# Patient Record
Sex: Male | Born: 1965
Health system: Southern US, Community
[De-identification: ages and names within clinical notes are randomized; demographics above are authoritative.]

## PROBLEM LIST (undated history)

## (undated) DIAGNOSIS — R55 Syncope and collapse: Secondary | ICD-10-CM

## (undated) DIAGNOSIS — I10 Essential (primary) hypertension: Secondary | ICD-10-CM

## (undated) DIAGNOSIS — K760 Fatty (change of) liver, not elsewhere classified: Secondary | ICD-10-CM

## (undated) DIAGNOSIS — G4733 Obstructive sleep apnea (adult) (pediatric): Secondary | ICD-10-CM

## (undated) DIAGNOSIS — E785 Hyperlipidemia, unspecified: Secondary | ICD-10-CM

## (undated) DIAGNOSIS — R519 Headache, unspecified: Secondary | ICD-10-CM

## (undated) DIAGNOSIS — R7303 Prediabetes: Secondary | ICD-10-CM

## (undated) DIAGNOSIS — R569 Unspecified convulsions: Secondary | ICD-10-CM

## (undated) DIAGNOSIS — Z8679 Personal history of other diseases of the circulatory system: Secondary | ICD-10-CM

## (undated) DIAGNOSIS — N200 Calculus of kidney: Secondary | ICD-10-CM

## (undated) HISTORY — PX: OTHER SURGICAL HISTORY: SHX169

---

## 2015-06-07 ENCOUNTER — Encounter: Payer: Self-pay | Admitting: *Deleted

## 2015-06-10 ENCOUNTER — Encounter
Admission: RE | Disposition: A | Discharge status: Home / Self Care | Payer: Self-pay | Source: Ambulatory Visit | Attending: Gastroenterology

## 2015-06-10 ENCOUNTER — Ambulatory Visit
Admission: RE | Admit: 2015-06-10 | Discharge: 2015-06-10 | Disposition: A | Discharge status: Home / Self Care | Payer: BLUE CROSS/BLUE SHIELD | Source: Ambulatory Visit | Attending: Gastroenterology | Admitting: Gastroenterology

## 2015-06-10 ENCOUNTER — Ambulatory Visit: Payer: BLUE CROSS/BLUE SHIELD | Admitting: *Deleted

## 2015-06-10 ENCOUNTER — Encounter: Payer: Self-pay | Admitting: *Deleted

## 2015-06-10 DIAGNOSIS — K621 Rectal polyp: Secondary | ICD-10-CM | POA: Insufficient documentation

## 2015-06-10 DIAGNOSIS — E785 Hyperlipidemia, unspecified: Secondary | ICD-10-CM | POA: Diagnosis not present

## 2015-06-10 DIAGNOSIS — E669 Obesity, unspecified: Secondary | ICD-10-CM | POA: Diagnosis not present

## 2015-06-10 DIAGNOSIS — G4730 Sleep apnea, unspecified: Secondary | ICD-10-CM | POA: Diagnosis not present

## 2015-06-10 DIAGNOSIS — R569 Unspecified convulsions: Secondary | ICD-10-CM | POA: Insufficient documentation

## 2015-06-10 DIAGNOSIS — Z6834 Body mass index (BMI) 34.0-34.9, adult: Secondary | ICD-10-CM | POA: Insufficient documentation

## 2015-06-10 DIAGNOSIS — Z888 Allergy status to other drugs, medicaments and biological substances status: Secondary | ICD-10-CM | POA: Diagnosis not present

## 2015-06-10 DIAGNOSIS — Z880 Allergy status to penicillin: Secondary | ICD-10-CM | POA: Diagnosis not present

## 2015-06-10 DIAGNOSIS — Z1211 Encounter for screening for malignant neoplasm of colon: Secondary | ICD-10-CM | POA: Insufficient documentation

## 2015-06-10 HISTORY — DX: Unspecified convulsions: R56.9

## 2015-06-10 HISTORY — PX: COLONOSCOPY WITH PROPOFOL: SHX5780

## 2015-06-10 HISTORY — DX: Hyperlipidemia, unspecified: E78.5

## 2015-06-10 SURGERY — COLONOSCOPY WITH PROPOFOL
Anesthesia: General

## 2015-06-10 MED ORDER — FENTANYL CITRATE (PF) 100 MCG/2ML IJ SOLN
INTRAMUSCULAR | Status: DC | PRN
  Administered 2015-06-10: 50 ug via INTRAVENOUS

## 2015-06-10 MED ORDER — SODIUM CHLORIDE 0.9 % IV SOLN
INTRAVENOUS | Status: DC
  Administered 2015-06-10: 15:00:00 via INTRAVENOUS

## 2015-06-10 MED ORDER — PROPOFOL 500 MG/50ML IV EMUL
INTRAVENOUS | Status: DC | PRN
  Administered 2015-06-10: 130 ug/kg/min via INTRAVENOUS

## 2015-06-10 MED ORDER — SODIUM CHLORIDE 0.9 % IV SOLN: INTRAVENOUS | Status: DC

## 2015-06-10 MED ORDER — MIDAZOLAM HCL 2 MG/2ML IJ SOLN
INTRAMUSCULAR | Status: DC | PRN
  Administered 2015-06-10: 1 mg via INTRAVENOUS

## 2015-06-10 NOTE — Transfer of Care (Signed)
Immediate Anesthesia Transfer of Care Note  Patient: Maxwell DoughtyDavid K Faron  Procedure(s) Performed: Procedure(s): COLONOSCOPY WITH PROPOFOL (N/A)  Patient Location: PACU and Endoscopy Unit  Anesthesia Type:General  Level of Consciousness: awake, alert  and oriented  Airway & Oxygen Therapy: Patient Spontanous Breathing and Patient connected to nasal cannula oxygen  Post-op Assessment: Report given to RN and Post -op Vital signs reviewed and stable  Post vital signs: Reviewed and stable  Last Vitals:  Filed Vitals:   06/10/15 1626  BP: 116/78  Pulse: 83  Temp: 35.7 C  Resp:     Complications: No apparent anesthesia complications

## 2015-06-10 NOTE — H&P (Signed)
Outpatient short stay form Pre-procedure 06/10/2015 3:49 PM Christena DeemMartin U Skulskie MD  Primary Physician: Dr. Hal Moralesavid Theis  Reason for visit:  Colonoscopy  History of present illness:  Patient is a-year-old male presenting for his initial colonoscopy. He takes no aspirin or blood thinning agents. He tolerated his prep well. He is no family history of colon cancer or colon polyps.    Current facility-administered medications:  .  0.9 %  sodium chloride infusion, , Intravenous, Continuous, Christena DeemMartin U Skulskie, MD, Last Rate: 10 mL/hr at 06/10/15 1515 .  0.9 %  sodium chloride infusion, , Intravenous, Continuous, Christena DeemMartin U Skulskie, MD  Prescriptions prior to admission  Medication Sig Dispense Refill Last Dose  . folic acid (FOLVITE) 400 MCG tablet Take 400 mcg by mouth daily.   06/09/2015 at Unknown time  . ibuprofen (ADVIL,MOTRIN) 200 MG tablet Take 200 mg by mouth every 6 (six) hours as needed.   Past Month at Unknown time  . phenytoin (DILANTIN) 100 MG ER capsule Take by mouth 3 (three) times daily.   06/09/2015 at Unknown time     Allergies  Allergen Reactions  . Keppra [Levetiracetam]   . Penicillins      Past Medical History  Diagnosis Date  . Seizures (HCC)   . Hyperlipidemia     Review of systems:      Physical Exam    Heart and lungs: Regular rate and rhythm without rub or gallop, lungs are bilaterally clear    HEENT: Normocephalic atraumatic eyes are anicteric    Other:     Pertinant exam for procedure: Soft nontender nondistended bowel sounds positive and normoactive    Planned proceedures: Colonoscopy and indicated procedures. I have discussed the risks benefits and complications of procedures to include not limited to bleeding, infection, perforation and the risk of sedation and the patient wishes to proceed.    Christena DeemMartin U Skulskie, MD Gastroenterology 06/10/2015  3:49 PM

## 2015-06-10 NOTE — Anesthesia Postprocedure Evaluation (Signed)
  Anesthesia Post-op Note  Patient: Maxwell DoughtyDavid K Monteverde  Procedure(s) Performed: Procedure(s): COLONOSCOPY WITH PROPOFOL (N/A)  Anesthesia type:General  Patient location: PACU  Post pain: Pain level controlled  Post assessment: Post-op Vital signs reviewed, Patient's Cardiovascular Status Stable, Respiratory Function Stable, Patent Airway and No signs of Nausea or vomiting  Post vital signs: Reviewed and stable  Last Vitals:  Filed Vitals:   06/10/15 1626  BP: 116/78  Pulse: 83  Temp: 35.7 C  Resp: 16    Level of consciousness: awake, alert  and patient cooperative  Complications: No apparent anesthesia complications

## 2015-06-10 NOTE — Anesthesia Preprocedure Evaluation (Signed)
Anesthesia Evaluation  Patient identified by MRN, date of birth, ID band Patient awake    Reviewed: Allergy & Precautions, NPO status , Patient's Chart, lab work & pertinent test results  Airway Mallampati: II  TM Distance: >3 FB Neck ROM: Full    Dental  (+) Teeth Intact   Pulmonary sleep apnea and Continuous Positive Airway Pressure Ventilation ,    Pulmonary exam normal        Cardiovascular Exercise Tolerance: Good negative cardio ROS Normal cardiovascular exam     Neuro/Psych Seizures -, Well Controlled,  Hx of seizure in 1995--none since--well controlled on anti-seizure meds.    GI/Hepatic negative GI ROS,   Endo/Other    Renal/GU      Musculoskeletal   Abdominal (+) + obese,   Peds  Hematology   Anesthesia Other Findings   Reproductive/Obstetrics                             Anesthesia Physical Anesthesia Plan  ASA: III  Anesthesia Plan: General   Post-op Pain Management:    Induction: Intravenous  Airway Management Planned: Nasal Cannula  Additional Equipment:   Intra-op Plan:   Post-operative Plan:   Informed Consent: I have reviewed the patients History and Physical, chart, labs and discussed the procedure including the risks, benefits and alternatives for the proposed anesthesia with the patient or authorized representative who has indicated his/her understanding and acceptance.     Plan Discussed with: CRNA  Anesthesia Plan Comments:         Anesthesia Quick Evaluation

## 2015-06-10 NOTE — Op Note (Signed)
[facility] Gastroenterology Patient Name: Maxwell Davis Procedure Date: 06/10/2015 3:52 PM MRN: [id number] Account #: [id number] Date of Birth: August 08, 1966 Admit Type: Outpatient Age: 49 Room: [facility] ENDO ROOM 3 Gender: Male Note Status: Finalized Procedure:         Colonoscopy Indications:       Screening for colorectal malignant neoplasm, This is the                     patient's first colonoscopy Providers:         Christena Deem, MD Referring MD:      Neomia Dear. Harrington Challenger, MD (Referring MD) Medicines:         Monitored Anesthesia Care Complications:     No immediate complications. Procedure:         Pre-Anesthesia Assessment:                    - ASA Grade Assessment: III - A patient with severe                     systemic disease.                    After obtaining informed consent, the colonoscope was                     passed under direct vision. Throughout the procedure, the                     patient's blood pressure, pulse, and oxygen saturations                     were monitored continuously. The Colonoscope was                     introduced through the anus and advanced to the the cecum,                     identified by appendiceal orifice and ileocecal valve. The                     quality of the bowel preparation was good. Findings:      Two sessile polyps were found in the rectum. The polyps were 1 to 2 mm       in size. These polyps were removed with a cold biopsy forceps. Resection       and retrieval were complete.      The digital rectal exam was normal.      The exam was otherwise without abnormality. Impression:        - Two 1 to 2 mm polyps in the rectum. Resected and                     retrieved.                    - The examination was otherwise normal. Recommendation:    - Await pathology results.                    - Telephone GI clinic for pathology results in 1 week. Procedure Code(s): --- Professional ---  [id number], Colonoscopy, flexible; with biopsy, single or                     multiple Diagnosis Code(s): --- Professional ---  V76.51, Special screening for malignant neoplasms of colon                    569.0, Anal and rectal polyp CPT copyright 2014 American Medical Association. All rights reserved. The codes documented in this report are preliminary and upon coder review may  be revised to meet current compliance requirements. Christena DeemMartin U Skulskie, MD 06/10/2015 4:24:21 PM This report has been signed electronically. Number of Addenda: 0 Note Initiated On: 06/10/2015 3:52 PM Scope Withdrawal Time: 0 hours 10 minutes 45 seconds  Total Procedure Duration: 0 hours 22 minutes 46 seconds       [facility]

## 2015-06-11 ENCOUNTER — Encounter: Payer: Self-pay | Admitting: Gastroenterology

## 2015-06-12 LAB — SURGICAL PATHOLOGY

## 2017-04-01 DIAGNOSIS — Z125 Encounter for screening for malignant neoplasm of prostate: Secondary | ICD-10-CM | POA: Diagnosis not present

## 2017-04-01 DIAGNOSIS — Z0000 Encounter for general adult medical examination without abnormal findings: Secondary | ICD-10-CM | POA: Diagnosis not present

## 2017-04-01 DIAGNOSIS — E782 Mixed hyperlipidemia: Secondary | ICD-10-CM | POA: Diagnosis not present

## 2017-04-01 DIAGNOSIS — R569 Unspecified convulsions: Secondary | ICD-10-CM | POA: Diagnosis not present

## 2017-04-01 DIAGNOSIS — Z79899 Other long term (current) drug therapy: Secondary | ICD-10-CM | POA: Diagnosis not present

## 2017-04-15 DIAGNOSIS — E669 Obesity, unspecified: Secondary | ICD-10-CM | POA: Diagnosis not present

## 2017-04-15 DIAGNOSIS — I10 Essential (primary) hypertension: Secondary | ICD-10-CM | POA: Diagnosis not present

## 2017-04-15 DIAGNOSIS — G4733 Obstructive sleep apnea (adult) (pediatric): Secondary | ICD-10-CM | POA: Diagnosis not present

## 2017-04-15 DIAGNOSIS — Z87898 Personal history of other specified conditions: Secondary | ICD-10-CM | POA: Diagnosis not present

## 2017-05-27 DIAGNOSIS — R569 Unspecified convulsions: Secondary | ICD-10-CM | POA: Diagnosis not present

## 2017-08-26 ENCOUNTER — Inpatient Hospital Stay (HOSPITAL_COMMUNITY)
Admit: 2017-08-26 | Discharge: 2017-08-26 | Disposition: A | Discharge status: Home / Self Care | Payer: BLUE CROSS/BLUE SHIELD | Attending: Internal Medicine | Admitting: Internal Medicine

## 2017-08-26 ENCOUNTER — Observation Stay
Admission: EM | Admit: 2017-08-26 | Discharge: 2017-08-27 | Disposition: A | Discharge status: Home / Self Care | Payer: BLUE CROSS/BLUE SHIELD | Attending: Internal Medicine | Admitting: Internal Medicine

## 2017-08-26 ENCOUNTER — Inpatient Hospital Stay: Payer: BLUE CROSS/BLUE SHIELD

## 2017-08-26 ENCOUNTER — Emergency Department: Payer: BLUE CROSS/BLUE SHIELD

## 2017-08-26 ENCOUNTER — Other Ambulatory Visit: Payer: Self-pay

## 2017-08-26 DIAGNOSIS — E785 Hyperlipidemia, unspecified: Secondary | ICD-10-CM | POA: Diagnosis not present

## 2017-08-26 DIAGNOSIS — I214 Non-ST elevation (NSTEMI) myocardial infarction: Secondary | ICD-10-CM | POA: Diagnosis not present

## 2017-08-26 DIAGNOSIS — R197 Diarrhea, unspecified: Secondary | ICD-10-CM | POA: Diagnosis not present

## 2017-08-26 DIAGNOSIS — Z79899 Other long term (current) drug therapy: Secondary | ICD-10-CM | POA: Diagnosis not present

## 2017-08-26 DIAGNOSIS — G4733 Obstructive sleep apnea (adult) (pediatric): Secondary | ICD-10-CM | POA: Diagnosis not present

## 2017-08-26 DIAGNOSIS — G40909 Epilepsy, unspecified, not intractable, without status epilepticus: Secondary | ICD-10-CM

## 2017-08-26 DIAGNOSIS — R748 Abnormal levels of other serum enzymes: Secondary | ICD-10-CM

## 2017-08-26 DIAGNOSIS — I10 Essential (primary) hypertension: Secondary | ICD-10-CM | POA: Insufficient documentation

## 2017-08-26 DIAGNOSIS — R7989 Other specified abnormal findings of blood chemistry: Secondary | ICD-10-CM | POA: Diagnosis not present

## 2017-08-26 DIAGNOSIS — Z6835 Body mass index (BMI) 35.0-35.9, adult: Secondary | ICD-10-CM | POA: Diagnosis not present

## 2017-08-26 DIAGNOSIS — I6523 Occlusion and stenosis of bilateral carotid arteries: Secondary | ICD-10-CM | POA: Diagnosis not present

## 2017-08-26 DIAGNOSIS — I959 Hypotension, unspecified: Principal | ICD-10-CM | POA: Insufficient documentation

## 2017-08-26 DIAGNOSIS — R55 Syncope and collapse: Secondary | ICD-10-CM | POA: Diagnosis not present

## 2017-08-26 DIAGNOSIS — I5A Non-ischemic myocardial injury (non-traumatic): Secondary | ICD-10-CM | POA: Diagnosis present

## 2017-08-26 DIAGNOSIS — M6281 Muscle weakness (generalized): Secondary | ICD-10-CM | POA: Diagnosis not present

## 2017-08-26 DIAGNOSIS — Z880 Allergy status to penicillin: Secondary | ICD-10-CM | POA: Insufficient documentation

## 2017-08-26 DIAGNOSIS — R778 Other specified abnormalities of plasma proteins: Secondary | ICD-10-CM | POA: Diagnosis present

## 2017-08-26 HISTORY — DX: Essential (primary) hypertension: I10

## 2017-08-26 HISTORY — DX: Morbid (severe) obesity due to excess calories: E66.01

## 2017-08-26 HISTORY — DX: Obstructive sleep apnea (adult) (pediatric): G47.33

## 2017-08-26 LAB — BASIC METABOLIC PANEL
Anion gap: 11 (ref 5–15)
BUN: 20 mg/dL (ref 6–20)
CALCIUM: 8.3 mg/dL — AB (ref 8.9–10.3)
CO2: 23 mmol/L (ref 22–32)
Chloride: 104 mmol/L (ref 101–111)
Creatinine, Ser: 1.21 mg/dL (ref 0.61–1.24)
GFR calc non Af Amer: >60 mL/min (ref >60)
Glucose, Bld: 243 mg/dL — ABNORMAL HIGH (ref 65–99)
Potassium: 3.7 mmol/L (ref 3.5–5.1)
Sodium: 138 mmol/L (ref 135–145)

## 2017-08-26 LAB — LIPID PANEL
CHOL/HDL RATIO: 4.3 ratio
CHOLESTEROL: 145 mg/dL (ref 0–200)
HDL: 34 mg/dL — AB (ref >40)
LDL Cholesterol: 78 mg/dL (ref 0–99)
Triglycerides: 165 mg/dL — ABNORMAL HIGH (ref <150)
VLDL: 33 mg/dL (ref 0–40)

## 2017-08-26 LAB — HEPATIC FUNCTION PANEL
ALT: 33 U/L (ref 17–63)
AST: 33 U/L (ref 15–41)
Albumin: 3.9 g/dL (ref 3.5–5.0)
Alkaline Phosphatase: 85 U/L (ref 38–126)
BILIRUBIN TOTAL: 0.7 mg/dL (ref 0.3–1.2)
Total Protein: 6.8 g/dL (ref 6.5–8.1)

## 2017-08-26 LAB — CBC WITH DIFFERENTIAL/PLATELET
BASOS ABS: 0 10*3/uL (ref 0–0.1)
BASOS PCT: 0 %
EOS ABS: 0.2 10*3/uL (ref 0–0.7)
EOS PCT: 2 %
HCT: 41.2 % (ref 40.0–52.0)
Hemoglobin: 14.2 g/dL (ref 13.0–18.0)
Lymphocytes Relative: 26 %
Lymphs Abs: 3.4 10*3/uL (ref 1.0–3.6)
MCH: 30.6 pg (ref 26.0–34.0)
MCHC: 34.4 g/dL (ref 32.0–36.0)
MCV: 89 fL (ref 80.0–100.0)
Monocytes Absolute: 0.6 10*3/uL (ref 0.2–1.0)
Monocytes Relative: 5 %
Neutro Abs: 8.7 10*3/uL — ABNORMAL HIGH (ref 1.4–6.5)
Neutrophils Relative %: 67 %
PLATELETS: 266 10*3/uL (ref 150–440)
RBC: 4.63 MIL/uL (ref 4.40–5.90)
RDW: 12.9 % (ref 11.5–14.5)
WBC: 12.9 10*3/uL — AB (ref 3.8–10.6)

## 2017-08-26 LAB — ECHOCARDIOGRAM COMPLETE
Height: 70 in
Weight: 3934.4 oz

## 2017-08-26 LAB — TROPONIN I
TROPONIN I: 0.06 ng/mL — AB (ref <0.03)
TROPONIN I: 0.27 ng/mL — AB (ref <0.03)
Troponin I: 0.29 ng/mL (ref <0.03)
Troponin I: 0.17 ng/mL (ref <0.03)
Troponin I: 0.1 ng/mL (ref <0.03)

## 2017-08-26 LAB — URINALYSIS, COMPLETE (UACMP) WITH MICROSCOPIC
Bacteria, UA: NONE SEEN
Bilirubin Urine: NEGATIVE
GLUCOSE, UA: NEGATIVE mg/dL
Ketones, ur: NEGATIVE mg/dL
Leukocytes, UA: NEGATIVE
Nitrite: NEGATIVE
PROTEIN: NEGATIVE mg/dL
Specific Gravity, Urine: 1.015 (ref 1.005–1.030)
pH: 5 (ref 5.0–8.0)

## 2017-08-26 LAB — LIPASE, BLOOD: Lipase: 39 U/L (ref 11–51)

## 2017-08-26 LAB — HEMOGLOBIN A1C
Hgb A1c MFr Bld: 5.4 % (ref 4.8–5.6)
MEAN PLASMA GLUCOSE: 108.28 mg/dL

## 2017-08-26 LAB — GLUCOSE, CAPILLARY
Glucose-Capillary: 88 mg/dL (ref 65–99)
Glucose-Capillary: 83 mg/dL (ref 65–99)

## 2017-08-26 LAB — CK: CK TOTAL: 131 U/L (ref 49–397)

## 2017-08-26 LAB — PHENYTOIN LEVEL, TOTAL: PHENYTOIN LVL: 7.1 ug/mL — AB (ref 10.0–20.0)

## 2017-08-26 MED ORDER — ONDANSETRON HCL 4 MG PO TABS: 4.0000 mg | ORAL_TABLET | Freq: Four times a day (QID) | ORAL | Status: DC | PRN

## 2017-08-26 MED ORDER — FOLIC ACID 1 MG PO TABS
500.0000 ug | ORAL_TABLET | Freq: Every day | ORAL | Status: DC
  Administered 2017-08-26: 0.5 mg via ORAL
  Filled 2017-08-26: qty 1

## 2017-08-26 MED ORDER — PHENYTOIN SODIUM EXTENDED 100 MG PO CAPS
400.0000 mg | ORAL_CAPSULE | Freq: Every day | ORAL | Status: DC
  Administered 2017-08-26: 400 mg via ORAL
  Filled 2017-08-26 (×2): qty 4

## 2017-08-26 MED ORDER — LISINOPRIL 20 MG PO TABS
20.0000 mg | ORAL_TABLET | Freq: Every day | ORAL | Status: DC
  Administered 2017-08-26: 20 mg via ORAL
  Filled 2017-08-26: qty 1

## 2017-08-26 MED ORDER — INSULIN ASPART 100 UNIT/ML ~~LOC~~ SOLN: 0.0000 [IU] | Freq: Three times a day (TID) | SUBCUTANEOUS | Status: DC

## 2017-08-26 MED ORDER — ACETAMINOPHEN 325 MG PO TABS: 650.0000 mg | ORAL_TABLET | Freq: Four times a day (QID) | ORAL | Status: DC | PRN

## 2017-08-26 MED ORDER — ENOXAPARIN SODIUM 40 MG/0.4ML ~~LOC~~ SOLN
40.0000 mg | SUBCUTANEOUS | Status: DC
  Administered 2017-08-26: 40 mg via SUBCUTANEOUS
  Filled 2017-08-26: qty 0.4

## 2017-08-26 MED ORDER — ONDANSETRON HCL 4 MG/2ML IJ SOLN: 4.0000 mg | Freq: Four times a day (QID) | INTRAMUSCULAR | Status: DC | PRN

## 2017-08-26 MED ORDER — SODIUM CHLORIDE 0.9% FLUSH: 3.0000 mL | Freq: Two times a day (BID) | INTRAVENOUS | Status: DC

## 2017-08-26 MED ORDER — ASPIRIN 81 MG PO CHEW
324.0000 mg | CHEWABLE_TABLET | Freq: Once | ORAL | Status: AC
  Administered 2017-08-26: 324 mg via ORAL
  Filled 2017-08-26: qty 4

## 2017-08-26 MED ORDER — ACETAMINOPHEN 650 MG RE SUPP: 650.0000 mg | Freq: Four times a day (QID) | RECTAL | Status: DC | PRN

## 2017-08-26 MED ORDER — SODIUM CHLORIDE 0.9 % IV SOLN
INTRAVENOUS | Status: DC
  Administered 2017-08-26 (×2): via INTRAVENOUS

## 2017-08-26 MED ORDER — PHENYTOIN SODIUM EXTENDED 100 MG PO CAPS
100.0000 mg | ORAL_CAPSULE | Freq: Every day | ORAL | Status: DC
  Filled 2017-08-26: qty 1

## 2017-08-26 NOTE — Progress Notes (Signed)
*  PRELIMINARY RESULTS* Echocardiogram 2D Echocardiogram has been performed.  Joanette GulaJoan M Madeline Bebout 08/26/2017, 12:44 PM

## 2017-08-26 NOTE — ED Triage Notes (Addendum)
Pt arrives to ED via [facility] from home with c/o a syncopal episode while ambulating to the BR. EMS also states family reports a possible seizure; pt has history of same, takes anti-seizure medications, reports last seizure occurred 20 yrs ago. Pt reports recent weakness and decreased PO intake, stated to EMS he felt like he "was coming down with something". Pt arrives with 20g PIV in place, 500mL NS given by EMS. Pt is A&O, in NAD; RR even, regular, and unlabored. Dr Lamont Snowballifenbark at bedside upon pt's arrival to ED.

## 2017-08-26 NOTE — ED Notes (Signed)
Dr Lamont Snowballifenbark made aware of pt's elevated Troponin level at this time as reported by lab: Troponin 0.06ng/mL.

## 2017-08-26 NOTE — Progress Notes (Signed)
Inpatient Diabetes Program Recommendations  AACE/ADA: New Consensus Statement on Inpatient Glycemic Control (2015)  Target Ranges:  Prepandial:   less than 140 mg/dL      Peak postprandial:   less than 180 mg/dL (1-2 hours)      Critically ill patients:  140 - 180 mg/dL   Results for Maxwell DoughtyDEVINE, Mahlik K (MRN [id number]) as of 08/26/2017 10:25  Ref. Range 08/26/2017 04:31  Glucose Latest Ref Range: 65 - 99 mg/dL 409243 (H)   Review of Glycemic Control  Diabetes history: No Outpatient Diabetes medications: NA Current orders for Inpatient glycemic control: None  Inpatient Diabetes Program Recommendations: Correction (SSI): While inpatient, please consider ordering CBGs and Novolog 0-9 units TID with meals and Novolog 0-5 untis QHS. HgbA1C: A1C in process.  NOTE: In reviewing chart, no history of DM noted but patient's family history has mother with DM noted. Lab glucose 243 mg/dl this morning at 8:114:31 am. Noted A1C has already been ordered and in process. While inpatient, please order CBGs with Novolog correction scale ACHS.  Thanks, Orlando PennerMarie Ortencia Askari, RN, MSN, CDE Diabetes Coordinator Inpatient Diabetes Program [phone] (Team Pager from 8am to 5pm)

## 2017-08-26 NOTE — Consult Note (Signed)
Cardiology Consult    Patient ID: ARTEZ REGIS MRN: [id number], DOB/AGE: 04-25-1966   Admit date: 08/26/2017 Date of Consult: 08/26/2017  Primary Physician: Mickey Farber, MD Primary Cardiologist: Julien Nordmann, MD- new Requesting Provider: Henreitta Leber, MD  Patient Profile    Maxwell Davis is a 52 y.o. male with a history of HTN, HL, obesity, Sz d/o, and OSA, who is being seen today for the evaluation of syncope and elevated troponin at the request of Dr. Nemiah Commander.  Past Medical History   Past Medical History:  Diagnosis Date  . Essential hypertension   . Hyperlipidemia   . Morbid obesity (HCC)   . OSA (obstructive sleep apnea)   . Seizures (HCC)    a. On dilantin.  Last seizure 1995.    Past Surgical History:  Procedure Laterality Date  . COLONOSCOPY WITH PROPOFOL N/A 06/10/2015   Procedure: COLONOSCOPY WITH PROPOFOL;  Surgeon: Christena Deem, MD;  Location: [facility] ENDOSCOPY;  Service: Endoscopy;  Laterality: N/A;  . right leg surgery     for tibial fracture     Allergies  Allergies  Allergen Reactions  . Keppra [Levetiracetam]   . Penicillins Rash    Has patient had a PCN reaction causing immediate rash, facial/tongue/throat swelling, SOB or lightheadedness with hypotension: Unknown Has patient had a PCN reaction causing severe rash involving mucus membranes or skin necrosis: Unknown Has patient had a PCN reaction that required hospitalization: No Has patient had a PCN reaction occurring within the last 10 years: No If all of the above answers are "NO", then may proceed with Cephalosporin use.     History of Present Illness    52 y/o ? with the above PMH including HTN, HL, OSA, obesity, and Sz d/o.  Last Sz was in 1995.  He is on dilantin chronically and is followed closely by neurology.  He has no prior cardiac history, though he does have a maternal uncle who died in his 46's of an MI.  He does not routinely exercise but walks his dog a few times/wk - ~  0.27miles, w/o symptoms or limitations.  He was in his usoh until the early morning hours of 1/17, when he awoke with right lower abdominal discomfort and the sensation that he had to have a BM.  He walked to the bathroom and had a bowel movement.  He says that he thinks he strained a little bit.  While sitting on the toilet, he became lightheaded and felt like he might lose consciousness.  In that setting, he eased himself to the floor.  He clearly says he remembers sliding himself down to the floor.  Once he was on the floor, he has less recollection of what was going on.  Apparently, his wife heard something causing her to find him on the floor unresponsive.  EMS was called and upon arrival, he was alert but very diaphoretic and hypotensive with blood pressures in the 70s.  He denies experiencing nausea, dyspnea, or chest pain.  He felt very weak.  He was taken to the [facility] emergency department where ECG was nonacute and he was hemodynamically stable with blood pressures ranging from the low 100s-130s and heart rates in the 80s-90s.  Troponin returned elevated at 0.06 and subsequent troponin of 0.27.  He was admitted for further evaluation.  Troponin has since risen slightly further to 0.29.  He is asymptomatic this morning.  No events on telemetry.  Inpatient Medications    . enoxaparin (LOVENOX) injection  40 mg Subcutaneous Q24H  . folic acid  500 mcg Oral Daily  . lisinopril  20 mg Oral Daily  . phenytoin  100 mg Oral QHS  . phenytoin  400 mg Oral QHS    Family History    Family History  Problem Relation Age of Onset  . Diabetes Mother        in her 64's  . Hypertension Mother   . Prostate cancer Father        in his 63's  . CAD Maternal Grandmother   . CAD Maternal Grandfather   . Other Sister        alive and well  . Heart attack Brother        died in his 64's   indicated that his mother is alive. He indicated that his father is alive. He indicated that his sister is alive. He  indicated that the status of his brother is unknown. He indicated that the status of his maternal grandmother is unknown. He indicated that the status of his maternal grandfather is unknown.   Social History    Social History   Socioeconomic History  . Marital status: Married    Spouse name: Not on file  . Number of children: Not on file  . Years of education: Not on file  . Highest education level: Not on file  Social Needs  . Financial resource strain: Not on file  . Food insecurity - worry: Not on file  . Food insecurity - inability: Not on file  . Transportation needs - medical: Not on file  . Transportation needs - non-medical: Not on file  Occupational History  . Not on file  Tobacco Use  . Smoking status: Never Smoker  . Smokeless tobacco: Never Used  Substance and Sexual Activity  . Alcohol use: No  . Drug use: No  . Sexual activity: Not on file  Other Topics Concern  . Not on file  Social History Narrative   Lives in [place] with his wife and dog.  He does not routinely exercise.     Review of Systems    General:  No chills, fever, night sweats or weight changes. +++ LH/diaphoresis/syncope Cardiovascular:  No chest pain, dyspnea on exertion, edema, orthopnea, palpitations, paroxysmal nocturnal dyspnea. Dermatological: No rash, lesions/masses Respiratory: No cough, dyspnea Urologic: No hematuria, dysuria Abdominal:   No nausea, vomiting, diarrhea, bright red blood per rectum, melena, or hematemesis Neurologic:  No visual changes, wkns, changes in mental status. All other systems reviewed and are otherwise negative except as noted above.  Physical Exam    Blood pressure 139/72, pulse 93, temperature 98.4 F (36.9 C), temperature source Oral, resp. rate 18, height 5\' 10"  (1.778 m), weight 245 lb 14.4 oz (111.5 kg), SpO2 94 %.  General: Pleasant, NAD Psych: Normal affect. Neuro: Alert and oriented X 3. Moves all extremities spontaneously. HEENT: Normal  Neck:  Supple without bruits or JVD. Lungs:  Resp regular and unlabored, CTA. Heart: RRR no s3, s4, or murmurs. Abdomen: Soft, non-tender, non-distended, BS + x 4.  Extremities: No clubbing, cyanosis or edema. DP/PT/Radials 2+ and equal bilaterally.  Labs     Recent Labs    08/26/17 0431 08/26/17 0622 08/26/17 0900  CKTOTAL 131  --   --   TROPONINI 0.06* 0.27* 0.29*   Lab Results  Component Value Date   WBC 12.9 (H) 08/26/2017   HGB 14.2 08/26/2017   HCT 41.2 08/26/2017   MCV 89.0 08/26/2017  PLT 266 08/26/2017    Recent Labs  Lab 08/26/17 0431  NA 138  K 3.7  CL 104  CO2 23  BUN 20  CREATININE 1.21  CALCIUM 8.3*  PROT 6.8  BILITOT 0.7  ALKPHOS 85  ALT 33  AST 33  GLUCOSE 243*   Lab Results  Component Value Date   CHOL 145 08/26/2017   HDL 34 (L) 08/26/2017   LDLCALC 78 08/26/2017   TRIG 165 (H) 08/26/2017     Radiology Studies    Dg Chest Port 1 View  Result Date: 08/26/2017 CLINICAL DATA:  52 y/o  M; syncopal episode.  History of seizures. EXAM: PORTABLE CHEST 1 VIEW COMPARISON:  None. FINDINGS: The heart size and mediastinal contours are within normal limits. Both lungs are clear. The visualized skeletal structures are unremarkable. IMPRESSION: No active disease. Electronically Signed   By: Mitzi HansenLance  Furusawa-Stratton M.D.   On: 08/26/2017 05:10    ECG & Cardiac Imaging    RSR, 85, no acute ST/T changes.  Assessment & Plan    1.  Syncope: Patient without prior cardiac history developed presyncope, syncope, weakness, and diaphoresis that initially started while using the toilet and having a bowel movement.  He thinks he may have strained a little bit.  He says he clearly remembers feeling lightheaded and eased himself to the floor but at that point, he apparently lost consciousness for some period of time.  Upon EMS arrival, he was diaphoretic and hypotensive with pressures in the 70s.  He denies experiencing chest pain, dyspnea, nausea, or vomiting.  Here, ECG  nonacute.  No events on telemetry.  Troponin mildly elevated at 0.29.  Echocardiogram pending to assess LV function.  Based on symptoms with onset of straining on the toilet, likely vasovagal.  Continue to follow telemetry.  Plan on a 30-day event monitor if no findings on telemetry.  2.  Elevated troponin: No prior history of chest pain, dyspnea, or coronary artery disease.  He does have a family history of premature coronary artery disease with a maternal uncle experiencing an MI in his 4850s.  Initial troponin was 0.06 with subsequent troponins of 0.27 and 0.29.  Will check echo to evaluate LV function.  If EF normal, we will plan on Myoview in the a.m.  If EF depressed, would likely need to pursue diagnostic catheterization.  3.  Essential hypertension: Blood pressure currently mildly elevated.  Continue lisinopril and adjust if necessary.  4.  Hyperlipidemia: He carries this diagnosis but is not on a statin.  LDL was 69 in August 2018.  5.  Seizure disorder: This appears to be stable.  He is on chronic Dilantin and has not had a seizure since 1995.  The symptoms that he describes in the setting of syncope do not sound to be seizure-like.  6.  Obstructive sleep apnea: CPAP at home.  Signed, Nicolasa Duckinghristopher Yuniel Blaney, NP 08/26/2017, 11:58 AM  For questions or updates, please contact   Please consult [email or web address] for contact info under Cardiology/STEMI.

## 2017-08-26 NOTE — H&P (Signed)
[facility] - [place] at [facility]   PATIENT NAME: Maxwell Davis    MR#:  [id number]  DATE OF BIRTH:  1966-04-14  DATE OF ADMISSION:  08/26/2017  PRIMARY CARE PHYSICIAN: Mickey Farber, MD   REQUESTING/REFERRING PHYSICIAN: Dr. Daryel November  CHIEF COMPLAINT:   Chief Complaint  Patient presents with  . Loss of Consciousness    HISTORY OF PRESENT ILLNESS:  Maxwell Davis  is a 52 y.o. male with a known history of hyperlipidemia, sleep apnea and seizure disorder presents to hospital secondary to syncopal episode. Patient is completely alert and oriented at this time and provides most of the history. His family at bedside also assisting with the history. Patient has had history of tonic-clonic seizures and some absent blank stating spells almost 22 years ago for which he has been on Dilantin. He hasn't had any recent seizures. Seen by neurology regularly as outpatient. He was in his normal state of health up until yesterday. Yesterday he felt like his sinuses are draining had some scratchy sore throat. Denies any fevers or chills. No nausea, vomiting or diarrhea. He woke up in the middle of the night to get to the bathroom and remember going to the bathroom and then felt lightheaded. His wife heard at home and so came to see him passed out in the restroom. Patient was out for a few seconds. When he came around, he was alert and oriented with initial minimal confusion. He was noted to be hypotensive with systolic blood pressure in the 70s when EMS arrived. No previous syncopal episodes. Denies any chest pain here. Could have been a vasovagal syncope with hypotension and straining. His second troponin is elevated at 0.27, so being admitted.  PAST MEDICAL HISTORY:   Past Medical History:  Diagnosis Date  . Hyperlipidemia   . OSA (obstructive sleep apnea)   . Seizures (HCC)     PAST SURGICAL HISTORY:   Past Surgical History:  Procedure Laterality Date  . COLONOSCOPY  WITH PROPOFOL N/A 06/10/2015   Procedure: COLONOSCOPY WITH PROPOFOL;  Surgeon: Christena Deem, MD;  Location: [facility] ENDOSCOPY;  Service: Endoscopy;  Laterality: N/A;  . right leg surgery     for tibial fracture    SOCIAL HISTORY:   Social History   Tobacco Use  . Smoking status: Never Smoker  . Smokeless tobacco: Never Used  Substance Use Topics  . Alcohol use: No    FAMILY HISTORY:   Family History  Problem Relation Age of Onset  . Diabetes Mother   . Prostate cancer Father   . CAD Maternal Grandmother   . CAD Maternal Grandfather     DRUG ALLERGIES:   Allergies  Allergen Reactions  . Keppra [Levetiracetam]   . Penicillins Rash    Has patient had a PCN reaction causing immediate rash, facial/tongue/throat swelling, SOB or lightheadedness with hypotension: Unknown Has patient had a PCN reaction causing severe rash involving mucus membranes or skin necrosis: Unknown Has patient had a PCN reaction that required hospitalization: No Has patient had a PCN reaction occurring within the last 10 years: No If all of the above answers are "NO", then may proceed with Cephalosporin use.     REVIEW OF SYSTEMS:   Review of Systems  Constitutional: Negative for chills, fever, malaise/fatigue and weight loss.  HENT: Positive for congestion and sore throat. Negative for ear discharge, ear pain, hearing loss, nosebleeds and tinnitus.   Eyes: Negative for blurred vision, double vision and photophobia.  Respiratory:  Negative for cough, hemoptysis, shortness of breath and wheezing.   Cardiovascular: Negative for chest pain, palpitations, orthopnea and leg swelling.  Gastrointestinal: Negative for abdominal pain, constipation, diarrhea, heartburn, melena, nausea and vomiting.  Genitourinary: Negative for dysuria, frequency, hematuria and urgency.  Musculoskeletal: Negative for back pain, myalgias and neck pain.  Skin: Negative for rash.  Neurological: Negative for dizziness,  tingling, tremors, sensory change, speech change, focal weakness and headaches.  Endo/Heme/Allergies: Does not bruise/bleed easily.  Psychiatric/Behavioral: Negative for depression.    MEDICATIONS AT HOME:   Prior to Admission medications   Medication Sig Start Date End Date Taking? Authorizing Provider  folic acid (FOLVITE) 400 MCG tablet Take 400 mcg by mouth daily.   Yes [provider]  Glucosamine-Chondroitin (GLUCOSAMINE CHONDR COMPLEX PO) Take 1 tablet by mouth at bedtime.   Yes [provider]  lisinopril (PRINIVIL,ZESTRIL) 20 MG tablet Take 20 mg by mouth daily. 07/13/17  Yes [provider]  phenytoin (DILANTIN) 100 MG ER capsule Take 400 mg by mouth at bedtime.    Yes [provider]      VITAL SIGNS:  Blood pressure 116/60, pulse 92, resp. rate (!) 21, height 5\' 10"  (1.778 m), weight 106.6 kg (235 lb), SpO2 93 %.  PHYSICAL EXAMINATION:   Physical Exam  GENERAL:  52 y.o.-year-old patient lying in the bed with no acute distress.  EYES: Pupils equal, round, reactive to light and accommodation. No scleral icterus. Extraocular muscles intact.  HEENT: Head atraumatic, normocephalic. Oropharynx and nasopharynx clear.  NECK:  Supple, no jugular venous distention. No thyroid enlargement, no tenderness.  LUNGS: Normal breath sounds bilaterally, no wheezing, rales,rhonchi or crepitation. No use of accessory muscles of respiration.  CARDIOVASCULAR: S1, S2 normal. No murmurs, rubs, or gallops.  ABDOMEN: Soft, nontender, nondistended. Bowel sounds present. No organomegaly or mass.  EXTREMITIES: No pedal edema, cyanosis, or clubbing.  NEUROLOGIC: Cranial nerves II through XII are intact. Muscle strength 5/5 in all extremities. Sensation intact. Gait not checked.  PSYCHIATRIC: The patient is alert and oriented x 3.  SKIN: No obvious rash, lesion, or ulcer.   LABORATORY PANEL:   CBC Recent Labs  Lab 08/26/17 0431  WBC 12.9*  HGB 14.2  HCT 41.2    PLT 266   ------------------------------------------------------------------------------------------------------------------  Chemistries  Recent Labs  Lab 08/26/17 0431  NA 138  K 3.7  CL 104  CO2 23  GLUCOSE 243*  BUN 20  CREATININE 1.21  CALCIUM 8.3*  AST 33  ALT 33  ALKPHOS 85  BILITOT 0.7   ------------------------------------------------------------------------------------------------------------------  Cardiac Enzymes Recent Labs  Lab 08/26/17 0622  TROPONINI 0.27*   ------------------------------------------------------------------------------------------------------------------  RADIOLOGY:  Dg Chest Port 1 View  Result Date: 08/26/2017 CLINICAL DATA:  52 y/o  M; syncopal episode.  History of seizures. EXAM: PORTABLE CHEST 1 VIEW COMPARISON:  None. FINDINGS: The heart size and mediastinal contours are within normal limits. Both lungs are clear. The visualized skeletal structures are unremarkable. IMPRESSION: No active disease. Electronically Signed   By: Mitzi HansenLance  Furusawa-Stratton M.D.   On: 08/26/2017 05:10    EKG:   Orders placed or performed during the hospital encounter of 08/26/17  . EKG 12-Lead  . EKG 12-Lead    IMPRESSION AND PLAN:   Judie GrieveDavid File  is a 52 y.o. male with a known history of hyperlipidemia, sleep apnea and seizure disorder presents to hospital secondary to syncopal episode.  1. Elevated troponin-could still be demand ischemia, no EKG changes. No chest pain -We will admit  to telemetry, echocardiogram -Recycle troponins. Lipid panel and A1c for risk stratification -Cardiology consulted. Hold off on heparin drip unless the next troponin is significantly elevated. -Check CPK level  2. Syncope-could be vasovagal syncope, triggered by hypotension and straining -Gentle hydration. Carotid Dopplers and echocardiogram  3. Seizure disorder-stable. This does not look like a seizure episode. Continue his Dilantin 500 mg. Check Dilantin  levels.  4. Sleep apnea-CPAP at bedtime  5. DVT prophylaxis-Lovenox    All the records are reviewed and case discussed with ED provider. Management plans discussed with the patient, family and they are in agreement.  CODE STATUS: Full Code  TOTAL TIME TAKING CARE OF THIS PATIENT: 50 minutes.    Enid Baas M.D on 08/26/2017 at 8:10 AM  Between 7am to 6pm - Pager - [phone]  After 6pm go to [email or web address] - Social research officer, government  [facility]  Office  [phone]  CC: Primary care physician; Mickey Farber, MD

## 2017-08-26 NOTE — ED Provider Notes (Signed)
[facility] Emergency Department Provider Note  ____________________________________________   First MD Initiated Contact with Patient 08/26/17 0430     (approximate)  I have reviewed the triage vital signs and the nursing notes.   HISTORY  Chief Complaint Loss of Consciousness  Level 5 exemption as the patient is amnestic to the event  HPI Maxwell Davis is a 52 y.o. male who comes to the emergency department via EMS after a possible syncopal episode.  The last thing the patient remembers was getting up at night to go to the bathroom to have a bowel movement and he woke up on the ground.  His wife states that she heard a "thump" and found the patient groggy and confused.  He was back to his baseline rather quickly.  When EMS first arrived they noted an initial blood pressure of 70/30 along with a normal blood glucose.  The patient does report a history of seizure disorder with last seizure 20 years ago.  He reports compliance with his phenytoin.  He denies antecedent chest pain or shortness of breath.  He has never seen a cardiologist.  He is currently asymptomatic.  Past Medical History:  Diagnosis Date  . Hyperlipidemia   . OSA (obstructive sleep apnea)   . Seizures (HCC)     There are no active problems to display for this patient.   Past Surgical History:  Procedure Laterality Date  . COLONOSCOPY WITH PROPOFOL N/A 06/10/2015   Procedure: COLONOSCOPY WITH PROPOFOL;  Surgeon: Christena Deem, MD;  Location: [facility] ENDOSCOPY;  Service: Endoscopy;  Laterality: N/A;    Prior to Admission medications   Medication Sig Start Date End Date Taking? Authorizing Provider  folic acid (FOLVITE) 400 MCG tablet Take 400 mcg by mouth daily.    [provider]  lisinopril (PRINIVIL,ZESTRIL) 20 MG tablet Take 20 mg by mouth daily. 07/13/17   [provider]  phenytoin (DILANTIN) 100 MG ER capsule Take 500 mg by mouth at bedtime.     [provider]    Allergies Keppra [levetiracetam] and Penicillins  No family history on file.  Social History Social History   Tobacco Use  . Smoking status: Never Smoker  . Smokeless tobacco: Never Used  Substance Use Topics  . Alcohol use: No  . Drug use: No    Review of Systems Level 5 exemption history limited as the patient is amnestic to the event  ____________________________________________   PHYSICAL EXAM:  VITAL SIGNS: ED Triage Vitals  Enc Vitals Group     BP      Pulse      Resp      Temp      Temp src      SpO2      Weight      Height      Head Circumference      Peak Flow      Pain Score      Pain Loc      Pain Edu?      Excl. in GC?     Constitutional: Alert and oriented x4 pleasant cooperative speaks in full clear sentences no diaphoresis Eyes: PERRL EOMI. mid range and brisk Head: Atraumatic. Nose: No congestion/rhinnorhea. Mouth/Throat: No trismus no bites of the tongue Neck: No stridor.   Cardiovascular: Normal rate, regular rhythm. Grossly normal heart sounds.  Good peripheral circulation. Respiratory: Normal respiratory effort.  No retractions. Lungs CTAB and moving good air Gastrointestinal: Soft nontender Musculoskeletal: No  lower extremity edema legs equal in size Neurologic:  Normal speech and language. No gross focal neurologic deficits are appreciated. Skin:  Skin is warm, dry and intact. No rash noted. Psychiatric: Mood and affect are normal. Speech and behavior are normal.    ____________________________________________   DIFFERENTIAL includes but not limited to  Cardia genic syncope, vasovagal syncope, seizure, acute coronary syndrome, dehydration ____________________________________________   LABS (all labs ordered are listed, but only abnormal results are displayed)  Labs Reviewed  BASIC METABOLIC PANEL - Abnormal; Notable for the following components:      Result Value   Glucose, Bld 243 (*)    Calcium 8.3  (*)    All other components within normal limits  HEPATIC FUNCTION PANEL - Abnormal; Notable for the following components:   Bilirubin, Direct <0.1 (*)    All other components within normal limits  CBC WITH DIFFERENTIAL/PLATELET - Abnormal; Notable for the following components:   WBC 12.9 (*)    Neutro Abs 8.7 (*)    All other components within normal limits  TROPONIN I - Abnormal; Notable for the following components:   Troponin I 0.06 (*)    All other components within normal limits  PHENYTOIN LEVEL, TOTAL - Abnormal; Notable for the following components:   Phenytoin Lvl 7.1 (*)    All other components within normal limits  TROPONIN I - Abnormal; Notable for the following components:   Troponin I 0.27 (*)    All other components within normal limits  LIPASE, BLOOD  URINALYSIS, COMPLETE (UACMP) WITH MICROSCOPIC  CK    Labs reviewed by me with elevated troponin which is rising concerning for primary myocardial ischemia __________________________________________  EKG  ED ECG REPORT I, Merrily BrittleNeil Adya Wirz, the attending physician, personally viewed and interpreted this ECG.  Date: 08/26/2017 EKG Time:  Rate: 85 Rhythm: normal sinus rhythm QRS Axis: normal Intervals: normal ST/T Wave abnormalities: normal Narrative Interpretation: no evidence of acute ischemia  ____________________________________________  RADIOLOGY  Chest x-ray reviewed by me with no acute disease ____________________________________________   PROCEDURES  Procedure(s) performed: no  .Critical Care Performed by: Merrily Brittleifenbark, Jonuel Butterfield, MD Authorized by: Merrily Brittleifenbark, Hazley Dezeeuw, MD   Critical care provider statement:    Critical care time (minutes):  35   Critical care time was exclusive of:  Separately billable procedures and treating other patients   Critical care was necessary to treat or prevent imminent or life-threatening deterioration of the following conditions:  Cardiac failure   Critical care was time  spent personally by me on the following activities:  Development of treatment plan with patient or surrogate, discussions with consultants, evaluation of patient's response to treatment, examination of patient, obtaining history from patient or surrogate, ordering and performing treatments and interventions, ordering and review of laboratory studies, ordering and review of radiographic studies, pulse oximetry, re-evaluation of patient's condition and review of old charts    Critical Care performed: yes  Observation: no ____________________________________________   INITIAL IMPRESSION / ASSESSMENT AND PLAN / ED COURSE  Pertinent labs & imaging results that were available during my care of the patient were reviewed by me and considered in my medical decision making (see chart for details).  On arrival the patient is quite well-appearing.  He did not note any postictal phase and he has no evidence of bites to his tongue or mouth.  This is most consistent with a syncopal event.  Broad labs including troponin are pending.  The first troponin came back slightly elevated with normal renal function which  is concerning for primary myocardial ischemia.  Second troponin is pending.  The patient's 2-hour troponin came back markedly elevated.  He currently remains asymptomatic.  324 mg of aspirin given the patient does require inpatient admission for his non-ST elevation myocardial infarction.  I discussed with the patient and his family who verbalized understanding and agree with the plan.  I then discussed with the hospitalist who is graciously agreed to admit the patient to his service.      ____________________________________________   FINAL CLINICAL IMPRESSION(S) / ED DIAGNOSES  Final diagnoses:  NSTEMI (non-ST elevated myocardial infarction) (HCC)      NEW MEDICATIONS STARTED DURING THIS VISIT:  New Prescriptions   No medications on file     Note:  This document was prepared using  Dragon voice recognition software and may include unintentional dictation errors.     Merrily Brittle, MD 08/26/17 [phone]

## 2017-08-27 ENCOUNTER — Other Ambulatory Visit: Payer: Self-pay

## 2017-08-27 ENCOUNTER — Inpatient Hospital Stay (HOSPITAL_BASED_OUTPATIENT_CLINIC_OR_DEPARTMENT_OTHER): Payer: BLUE CROSS/BLUE SHIELD

## 2017-08-27 ENCOUNTER — Telehealth: Payer: Self-pay

## 2017-08-27 ENCOUNTER — Other Ambulatory Visit: Payer: Self-pay | Admitting: Nurse Practitioner

## 2017-08-27 DIAGNOSIS — I248 Other forms of acute ischemic heart disease: Secondary | ICD-10-CM | POA: Diagnosis not present

## 2017-08-27 DIAGNOSIS — E785 Hyperlipidemia, unspecified: Secondary | ICD-10-CM | POA: Diagnosis not present

## 2017-08-27 DIAGNOSIS — R7989 Other specified abnormal findings of blood chemistry: Secondary | ICD-10-CM | POA: Diagnosis not present

## 2017-08-27 DIAGNOSIS — R55 Syncope and collapse: Secondary | ICD-10-CM | POA: Diagnosis not present

## 2017-08-27 DIAGNOSIS — G40909 Epilepsy, unspecified, not intractable, without status epilepticus: Secondary | ICD-10-CM | POA: Diagnosis not present

## 2017-08-27 LAB — BASIC METABOLIC PANEL
ANION GAP: 9 (ref 5–15)
BUN: 14 mg/dL (ref 6–20)
CHLORIDE: 106 mmol/L (ref 101–111)
CO2: 23 mmol/L (ref 22–32)
Calcium: 8.6 mg/dL — ABNORMAL LOW (ref 8.9–10.3)
Creatinine, Ser: 0.91 mg/dL (ref 0.61–1.24)
GFR calc Af Amer: >60 mL/min (ref >60)
Glucose, Bld: 115 mg/dL — ABNORMAL HIGH (ref 65–99)
POTASSIUM: 4 mmol/L (ref 3.5–5.1)
SODIUM: 138 mmol/L (ref 135–145)

## 2017-08-27 LAB — NM MYOCAR MULTI W/SPECT W/WALL MOTION / EF
CHL CUP MPHR: 168 {beats}/min
CHL CUP NUCLEAR SDS: 0
CSEPEDS: 45 s
Estimated workload: 9.7 METS
Exercise duration (min): 7 min
LV sys vol: 38 mL
LVDIAVOL: 95 mL (ref 62–150)
NUC STRESS TID: 0.93
Peak HR: 157 {beats}/min
Percent HR: 93 %
Rest HR: 81 {beats}/min
SRS: 2
SSS: 0

## 2017-08-27 LAB — CBC
HEMATOCRIT: 41.5 % (ref 40.0–52.0)
HEMOGLOBIN: 14.3 g/dL (ref 13.0–18.0)
MCH: 30.7 pg (ref 26.0–34.0)
MCHC: 34.4 g/dL (ref 32.0–36.0)
MCV: 89.2 fL (ref 80.0–100.0)
Platelets: 230 10*3/uL (ref 150–440)
RBC: 4.65 MIL/uL (ref 4.40–5.90)
RDW: 12.8 % (ref 11.5–14.5)
WBC: 9.4 10*3/uL (ref 3.8–10.6)

## 2017-08-27 LAB — PHENYTOIN LEVEL, FREE AND TOTAL
PHENYTOIN FREE: 0.9 ug/mL — AB (ref 1.0–2.0)
Phenytoin, Total: 7.7 ug/mL — ABNORMAL LOW (ref 10.0–20.0)

## 2017-08-27 LAB — GLUCOSE, CAPILLARY: Glucose-Capillary: 90 mg/dL (ref 65–99)

## 2017-08-27 LAB — HIV ANTIBODY (ROUTINE TESTING W REFLEX): HIV Screen 4th Generation wRfx: NONREACTIVE

## 2017-08-27 MED ORDER — TECHNETIUM TC 99M TETROFOSMIN IV KIT
32.2000 | PACK | Freq: Once | INTRAVENOUS | Status: AC | PRN
  Administered 2017-08-27: 32.2 via INTRAVENOUS

## 2017-08-27 MED ORDER — TECHNETIUM TC 99M TETROFOSMIN IV KIT
13.4200 | PACK | Freq: Once | INTRAVENOUS | Status: AC | PRN
  Administered 2017-08-27: 13.42 via INTRAVENOUS

## 2017-08-27 NOTE — Discharge Summary (Signed)
[employer] - [place] at [facility]   PATIENT NAME: Maxwell Davis    MR#:  [id number]  DATE OF BIRTH:  [birth year]  DATE OF ADMISSION:  08/26/2017   ADMITTING PHYSICIAN: Enid Baas, MD  DATE OF DISCHARGE: 08/27/2017  1:00 PM  PRIMARY CARE PHYSICIAN: Mickey Farber, MD   ADMISSION DIAGNOSIS:   Syncope [R55] NSTEMI (non-ST elevated myocardial infarction) (HCC) [I21.4]  DISCHARGE DIAGNOSIS:   Principal Problem:   Vasovagal syncope Active Problems:   Elevated troponin   Diarrhea   Seizure disorder (HCC)   SECONDARY DIAGNOSIS:   Past Medical History:  Diagnosis Date  . Essential hypertension   . Hyperlipidemia   . Morbid obesity (HCC)   . OSA (obstructive sleep apnea)   . Seizures (HCC)    a. On dilantin.  Last seizure 1995.    HOSPITAL COURSE:   Erek Kowal  is a 52 y.o. male with a known history of hyperlipidemia, sleep apnea and seizure disorder presents to hospital secondary to syncopal episode.  1. Elevated troponin- demand ischemia secondary to hypotension, no EKG changes. No chest pain -Troponins have plateaued. Echocardiogram done and is negative for any wall motion abnormalities. Normal echo. -Appreciate cardiology consult. Stress test done was negative for any ischemia  2. Syncope-vasovagal syncope, triggered by hypotension and straining-had diarrhea/bowel movement prior to passing out. -Improved with IV fluids. Carotid Dopplers without any hemodynamically significant stenosis. -Echo is normal. Outpatient cardiac monitoring if needed  3. Seizure disorder-stable. Hasn't had seizure in more than 20 years. Follows with neurology as outpatient. Continue home dose of Dilantin.  4. Sleep apnea-CPAP at bedtime   Stable and at baseline for discharge today   DISCHARGE CONDITIONS:   Guarded  CONSULTS OBTAINED:   Treatment Team:  Antonieta Iba, MD  DRUG ALLERGIES:   Allergies  Allergen Reactions  . Keppra  [Levetiracetam]   . Penicillins Rash    Has patient had a PCN reaction causing immediate rash, facial/tongue/throat swelling, SOB or lightheadedness with hypotension: Unknown Has patient had a PCN reaction causing severe rash involving mucus membranes or skin necrosis: Unknown Has patient had a PCN reaction that required hospitalization: No Has patient had a PCN reaction occurring within the last 10 years: No If all of the above answers are "NO", then may proceed with Cephalosporin use.    DISCHARGE MEDICATIONS:   Allergies as of 08/27/2017      Reactions   Keppra [levetiracetam]    Penicillins Rash   Has patient had a PCN reaction causing immediate rash, facial/tongue/throat swelling, SOB or lightheadedness with hypotension: Unknown Has patient had a PCN reaction causing severe rash involving mucus membranes or skin necrosis: Unknown Has patient had a PCN reaction that required hospitalization: No Has patient had a PCN reaction occurring within the last 10 years: No If all of the above answers are "NO", then may proceed with Cephalosporin use.      Medication List    TAKE these medications   folic acid 400 MCG tablet Commonly known as:  FOLVITE Take 400 mcg by mouth daily.   GLUCOSAMINE CHONDR COMPLEX PO Take 1 tablet by mouth at bedtime.   lisinopril 20 MG tablet Commonly known as:  PRINIVIL,ZESTRIL Take 20 mg by mouth daily.   phenytoin 100 MG ER capsule Commonly known as:  DILANTIN Take 400 mg by mouth at bedtime.        DISCHARGE INSTRUCTIONS:   1. PCP follow-up in 1-2 weeks 2. Neurology follow-up as prior scheduled  DIET:   Cardiac diet  ACTIVITY:   Activity as tolerated  OXYGEN:   Home Oxygen: No.  Oxygen Delivery: room air  DISCHARGE LOCATION:   home   If you experience worsening of your admission symptoms, develop shortness of breath, life threatening emergency, suicidal or homicidal thoughts you must seek medical attention immediately by  calling 911 or calling your MD immediately  if symptoms less severe.  You Must read complete instructions/literature along with all the possible adverse reactions/side effects for all the Medicines you take and that have been prescribed to you. Take any new Medicines after you have completely understood and accpet all the possible adverse reactions/side effects.   Please note  You were cared for by a hospitalist during your hospital stay. If you have any questions about your discharge medications or the care you received while you were in the hospital after you are discharged, you can call the unit and asked to speak with the hospitalist on call if the hospitalist that took care of you is not available. Once you are discharged, your primary care physician will handle any further medical issues. Please note that NO REFILLS for any discharge medications will be authorized once you are discharged, as it is imperative that you return to your primary care physician (or establish a relationship with a primary care physician if you do not have one) for your aftercare needs so that they can reassess your need for medications and monitor your lab values.    On the day of Discharge:  VITAL SIGNS:   Blood pressure 123/77, pulse 80, temperature 98 F (36.7 C), temperature source Oral, resp. rate 18, height 5\' 10"  (1.778 m), weight 111.5 kg (245 lb 14.4 oz), SpO2 97 %.  PHYSICAL EXAMINATION:    GENERAL:  52 y.o.-year-old patient lying in the bed with no acute distress.  EYES: Pupils equal, round, reactive to light and accommodation. No scleral icterus. Extraocular muscles intact.  HEENT: Head atraumatic, normocephalic. Oropharynx and nasopharynx clear.  NECK:  Supple, no jugular venous distention. No thyroid enlargement, no tenderness.  LUNGS: Normal breath sounds bilaterally, no wheezing, rales,rhonchi or crepitation. No use of accessory muscles of respiration.  CARDIOVASCULAR: S1, S2 normal. No murmurs,  rubs, or gallops.  ABDOMEN: Soft, non-tender, non-distended. Bowel sounds present. No organomegaly or mass.  EXTREMITIES: No pedal edema, cyanosis, or clubbing.  NEUROLOGIC: Cranial nerves II through XII are intact. Muscle strength 5/5 in all extremities. Sensation intact. Gait not checked.  PSYCHIATRIC: The patient is alert and oriented x 3.  SKIN: No obvious rash, lesion, or ulcer.   DATA REVIEW:   CBC Recent Labs  Lab 08/27/17 0531  WBC 9.4  HGB 14.3  HCT 41.5  PLT 230    Chemistries  Recent Labs  Lab 08/26/17 0431 08/27/17 0531  NA 138 138  K 3.7 4.0  CL 104 106  CO2 23 23  GLUCOSE 243* 115*  BUN 20 14  CREATININE 1.21 0.91  CALCIUM 8.3* 8.6*  AST 33  --   ALT 33  --   ALKPHOS 85  --   BILITOT 0.7  --      Microbiology Results  No results found for this or any previous visit.  RADIOLOGY:  Nm Myocar Multi W/spect W/wall Motion / Ef  Result Date: 08/27/2017 Exercise myocardial perfusion imaging study with no significant  ischemia Normal wall motion, EF estimated at 52% No EKG changes concerning for ischemia at peak stress or in recovery. Normal blood pressure  response to exercise Low risk scan Signed, Dossie Arbour, MD, Ph.D [facility]     Management plans discussed with the patient, family and they are in agreement.  CODE STATUS:     Code Status Orders  (From admission, onward)        Start     Ordered   08/26/17 0831  Full code  Continuous     08/26/17 0831    Code Status History    Date Active Date Inactive Code Status Order ID Comments User Context   This patient has a current code status but no historical code status.      TOTAL TIME TAKING CARE OF THIS PATIENT: 38 minutes.    Enid Baas M.D on 08/27/2017 at 1:50 PM  Between 7am to 6pm - Pager - [phone]  After 6pm go to [email or web address] - Scientist, research (life sciences) [facility]  Office  [phone]  CC: Primary care physician; Mickey Farber,  MD   Note: This dictation was prepared with Dragon dictation along with smaller phrase technology. Any transcriptional errors that result from this process are unintentional.

## 2017-08-27 NOTE — Telephone Encounter (Signed)
TCM....  Patient is being discharged   They saw C. Brion AlimentBerge  They are scheduled to see R. Dunn on 2/6  They were seen for syncope and elevated troponin   They need to be seen within 1-2 weeks  Pt not added to wait list   Please call

## 2017-08-27 NOTE — Progress Notes (Signed)
Iv and tele removed from patient. Discharge instructions given to patient, verbalized understanding. No distress at this time. Wife at bedside and will transport patient home.

## 2017-08-27 NOTE — Progress Notes (Signed)
Progress Note  Patient Name: Maxwell DoughtyDavid K Davis Date of Encounter: 08/27/2017  Primary Cardiologist: Maxwell Nordmannimothy Gollan, MD  Subjective   No chest pain, sob, palpitation, presyncope/syncope.  No events on tele.  For myoview this AM.  Inpatient Medications    Scheduled Meds: . enoxaparin (LOVENOX) injection  40 mg Subcutaneous Q24H  . folic acid  500 mcg Oral Daily  . insulin aspart  0-9 Units Subcutaneous TID WC  . lisinopril  20 mg Oral Daily  . phenytoin  400 mg Oral QHS  . sodium chloride flush  3 mL Intravenous Q12H   Continuous Infusions: . sodium chloride 75 mL/hr at 08/26/17 2305   PRN Meds: acetaminophen **OR** acetaminophen, ondansetron **OR** ondansetron (ZOFRAN) IV   Vital Signs    Vitals:   08/26/17 1020 08/26/17 1749 08/26/17 1946 08/27/17 0521  BP: 139/72 123/61 116/79 123/77  Pulse: 93 87 87 80  Resp:  18 18 18   Temp:  99 F (37.2 C) 98.1 F (36.7 C) 98 F (36.7 C)  TempSrc:  Oral Oral Oral  SpO2:  98% 99% 97%  Weight:      Height:        Intake/Output Summary (Last 24 hours) at 08/27/2017 0745 Last data filed at 08/27/2017 0527 Gross per 24 hour  Intake 903.75 ml  Output 0 ml  Net 903.75 ml   Filed Weights   08/26/17 0434 08/26/17 0833  Weight: 235 lb (106.6 kg) 245 lb 14.4 oz (111.5 kg)    Physical Exam   GEN: Well nourished, well developed, in no acute distress.  HEENT: Grossly normal.  Neck: Supple, no JVD, carotid bruits, or masses. Cardiac: RRR, no murmurs, rubs, or gallops. No clubbing, cyanosis, edema.  Radials/DP/PT 2+ and equal bilaterally.  Respiratory:  Respirations regular and unlabored, clear to auscultation bilaterally. GI: Soft, nontender, nondistended, BS + x 4. MS: no deformity or atrophy. Skin: warm and dry, no rash. Neuro:  Strength and sensation are intact. Psych: AAOx3.  Normal affect.  Labs    Chemistry Recent Labs  Lab 08/26/17 0431 08/27/17 0531  NA 138 138  K 3.7 4.0  CL 104 106  CO2 23 23  GLUCOSE 243*  115*  BUN 20 14  CREATININE 1.21 0.91  CALCIUM 8.3* 8.6*  PROT 6.8  --   ALBUMIN 3.9  --   AST 33  --   ALT 33  --   ALKPHOS 85  --   BILITOT 0.7  --   GFRNONAA >60 >60  GFRAA >60 >60  ANIONGAP 11 9     Hematology Recent Labs  Lab 08/26/17 0431 08/27/17 0531  WBC 12.9* 9.4  RBC 4.63 4.65  HGB 14.2 14.3  HCT 41.2 41.5  MCV 89.0 89.2  MCH 30.6 30.7  MCHC 34.4 34.4  RDW 12.9 12.8  PLT 266 230    Cardiac Enzymes Recent Labs  Lab 08/26/17 0622 08/26/17 0900 08/26/17 1411 08/26/17 2003  TROPONINI 0.27* 0.29* 0.17* 0.10*      Radiology    [place] Carotid Bilateral  Result Date: 08/26/2017 CLINICAL DATA:  Syncope EXAM: BILATERAL CAROTID DUPLEX ULTRASOUND TECHNIQUE: Wallace CullensGray scale imaging, color Doppler and duplex ultrasound were performed of bilateral carotid and vertebral arteries in the neck. COMPARISON:  None. FINDINGS: Criteria: Quantification of carotid stenosis is based on velocity parameters that correlate the residual internal carotid diameter with NASCET-based stenosis levels, using the diameter of the distal internal carotid lumen as the denominator for stenosis measurement. The following velocity measurements were  obtained: RIGHT ICA:  116 cm/sec CCA:  131 cm/sec SYSTOLIC ICA/CCA RATIO:  0.9 DIASTOLIC ICA/CCA RATIO:  1.6 ECA:  147 cm/sec LEFT ICA:  106 cm/sec CCA:  125 cm/sec SYSTOLIC ICA/CCA RATIO:  0.8 DIASTOLIC ICA/CCA RATIO:  0.8 ECA:  96 cm/sec RIGHT CAROTID ARTERY: Little if any plaque in the bulb. Low resistance internal carotid Doppler pattern. RIGHT VERTEBRAL ARTERY:  Antegrade. LEFT CAROTID ARTERY: Little if any plaque in the bulb. Low resistance internal carotid Doppler pattern. LEFT VERTEBRAL ARTERY:  Antegrade. IMPRESSION: Less than 50% stenosis in the right and left internal carotid arteries. Electronically Signed   By: Jolaine Click M.D.   On: 08/26/2017 14:57   Dg Chest Port 1 View  Result Date: 08/26/2017 CLINICAL DATA:  52 y/o  M; syncopal episode.  History  of seizures. EXAM: PORTABLE CHEST 1 VIEW COMPARISON:  None. FINDINGS: The heart size and mediastinal contours are within normal limits. Both lungs are clear. The visualized skeletal structures are unremarkable. IMPRESSION: No active disease. Electronically Signed   By: Mitzi Hansen M.D.   On: 08/26/2017 05:10    Telemetry    RSR - Personally Reviewed  Cardiac Studies   2D Echocardiogram 1.17.2018  ------------------------------------------------------------------- Study Conclusions   - Left ventricle: The cavity size was normal. Systolic function was   normal. The estimated ejection fraction was in the range of 60%   to 65%. Wall motion was normal; there were no regional wall   motion abnormalities. Left ventricular diastolic function   parameters were normal. - Left atrium: The atrium was normal in size. - Right ventricle: Systolic function was normal. - Pulmonary arteries: Systolic pressure was within the normal   range.  Patient Profile     52 y.o. male w/ a h/o HNT, hL, obesity, sz d/o, and OSA, who was admitted 1/17 following a syncopal spell that occurred during a bowel mvmt. He was found by wife. Hypotensive with BP in 70's when EMS arrived.  Echo 1/17 showed nl EF.  Assessment & Plan    1.  Syncope:  No prior cardiac hx.  Developed presyncope in setting of abd discomfort and straining to have BM.  Subsequently had syncope and diarrhea.  Diaphoretic and hypotensive on EMS arrival.  Hemodynamically stable since admission.  No events on tele.  Echo w/ nl EF.  No recurrent symptoms.  Suspect vasovagal episode.  Will arrange for 30 day event monitor.  2.  Elevated troponin:  In setting of above.  No chest pain, sob.  Echo w/ nl EF - reassuring.  This does not appear to be ACS.  ? Role of prolonged hypotension.  For MV today. If negative, ok for d/c this afternoon.  3.  Essential HTN:  Stable on home dose of lisinopril.  4.  HL: LDL 78 1/17.  Not on statin.  5.   OSA:  CPAP @ home.  6.  Sz d/o: no sz since 1995.  On dilatntin.  Signed, Nicolasa Ducking, NP  08/27/2017, 7:45 AM    For questions or updates, please contact   Please consult [email or web address] for contact info under Cardiology/STEMI.

## 2017-08-27 NOTE — Telephone Encounter (Signed)
Left voicemail message to call back  

## 2017-08-30 NOTE — Telephone Encounter (Signed)
Patient [facility].  Patient understands to follow up with provider Eula Listenyan Dunn PA on 09/15/17 at 09:30[facility]. Patient understands discharge instructions? Yes Patient understands medications and regiment? Yes Patient understands to bring all medications to this visit? Yes  Patient verbalized understanding with no further questions at this time.

## 2017-08-31 NOTE — Telephone Encounter (Signed)
Patient reports that he received call from our number but I do not see where anyone called today. Advised that I did call and leave message yesterday but not today. He verbalized understanding with no further questions.

## 2017-09-03 DIAGNOSIS — R55 Syncope and collapse: Secondary | ICD-10-CM | POA: Diagnosis not present

## 2017-09-03 DIAGNOSIS — I10 Essential (primary) hypertension: Secondary | ICD-10-CM | POA: Diagnosis not present

## 2017-09-03 DIAGNOSIS — R569 Unspecified convulsions: Secondary | ICD-10-CM | POA: Diagnosis not present

## 2017-09-03 DIAGNOSIS — G4733 Obstructive sleep apnea (adult) (pediatric): Secondary | ICD-10-CM | POA: Diagnosis not present

## 2017-09-10 ENCOUNTER — Telehealth: Payer: Self-pay | Admitting: Cardiovascular Disease

## 2017-09-10 NOTE — Telephone Encounter (Signed)
Pt calling stating he has an appointment in march to see us  But has not seen us yet, but is asking about receive a monitor from us He is scheduled to see Dr Mariah MillingGollan 10/12/17   Please advise

## 2017-09-10 NOTE — Telephone Encounter (Signed)
I called and spoke with the patient.  I advised him the order for his monitor was placed, but nursing was unaware and he was never enrolled with [employer].  I advised him I have enrolled him today and he should be receiving his monitor in the next few days.  He voices understanding.   Patient was enrolled in [employer].

## 2017-09-15 ENCOUNTER — Ambulatory Visit (INDEPENDENT_AMBULATORY_CARE_PROVIDER_SITE_OTHER): Payer: BLUE CROSS/BLUE SHIELD

## 2017-09-15 ENCOUNTER — Ambulatory Visit: Payer: BLUE CROSS/BLUE SHIELD | Admitting: Physician Assistant

## 2017-09-15 DIAGNOSIS — R55 Syncope and collapse: Secondary | ICD-10-CM

## 2017-10-03 ENCOUNTER — Telehealth: Payer: Self-pay | Admitting: Physician Assistant

## 2017-10-03 NOTE — Telephone Encounter (Signed)
Paged by device company, patient has 9 beats of NSVT @ 12:13 pm. Sinus rhythm.  He was sitting in chair and suddenly felt chest pressure. Last for few seconds. No SOB, dizziness, palpitation of syncope. Will fax strip for review. Encouraged hydration with with lots of electrolytes.

## 2017-10-05 ENCOUNTER — Telehealth: Payer: Self-pay | Admitting: Cardiovascular Disease

## 2017-10-05 NOTE — Telephone Encounter (Signed)
Spoke with patient to see if he is still wearing monitor and if he had any problems. Also reviewed that if he can push button to alert us of any symptoms that would be helpful so that we can see what is happening. He was appreciative for the follow up call and had no questions or concerns at this time.

## 2017-10-05 NOTE — Telephone Encounter (Signed)
Left voicemail message for patient to call back regarding monitor use.

## 2017-10-05 NOTE — Telephone Encounter (Signed)
Pt is returning your call

## 2017-10-10 NOTE — Progress Notes (Deleted)
Cardiology Office Note  Date:  10/10/2017   ID:  Maxwell Davis, DOB Jul 31, 1966, MRN [id number]  PCP:  Maxwell Davis, Antwaun, MD   No chief complaint on file.   HPI:  [name],  [name] presents to hospital 08/2017 secondary to syncopal episode. straining-had diarrhea/bowel movement prior to passing out.  Hospital records reviewed with the patient in detail Reports he was in usual state of health, in the night with  lower abdominal cramping, had a bowel movement, started to feel dizzy while on the toilet, transitioned himself to the floor then lost consciousness. Wife apparently heard a noise, found  him on the ground, called 911 Family also reports large amount of diarrhea on the ground around him.  Patient reports that he apparently "let loose" while on the ground  At the scene when EMTs arrived he was hypotensive systolic pressure in the 70s. was unable to roll over onto a stretcher as he felt very weak, required assistance Reports having relatively quick recovery on route to the emergency room and has felt well since then  He does eat out periodically, likes Maxwell HomerLa fiesta Reports having periodic constipation Family at the bedside reports he does not drink enough, maybe 2 small bottles of water a day    Elevated troponin- demand ischemia   Normal echo.  Stress test done was negative for any ischemia  Syncope-vasovagal syncope, triggered by hypotension and straining-had diarrhea/bowel movement prior to passing out.  . Carotid Dopplers without any hemodynamically significant stenosis.  3. Seizure disorder-stable. Hasn't had seizure in more than 20 years. on Dilantin.  4. Sleep apnea-CPAP at bedtime     PMH:   has a past medical history of Essential hypertension, Hyperlipidemia, Morbid obesity (HCC), OSA (obstructive sleep apnea), and Seizures (HCC).  PSH:    Past Surgical  History:  Procedure Laterality Date  . COLONOSCOPY WITH PROPOFOL N/A 06/10/2015   Procedure: COLONOSCOPY WITH PROPOFOL;  Surgeon: Maxwell DeemMartin U Skulskie, MD;  [facility] END[facility]COPY;  Service: Endoscopy;  Laterality: N/A;  . right leg surgery     for tibial fracture    Current Outpatient Medications  Medication Sig Dispense Refill  . folic acid (FOLVITE) 400 MCG tablet Take 400 mcg by mouth daily.    . Glucosamine-Chondroitin (GLUCOSAMINE CHONDR COMPLEX PO) Take 1 tablet by mouth at bedtime.    Marland Kitchen. lisinopril (PRINIVIL,ZESTRIL) 20 MG tablet Take 20 mg by mouth daily.  2  . phenytoin (DILANTIN) 100 MG ER capsule Take 400 mg by mouth at bedtime.      No current facility-administered medications for this visit.      Allergies:   Keppra [levetiracetam] and Penicillins   Social History:  The patient  reports that  has never smoked. he has never used smokeless tobacco. He reports that he does not drink alcohol or use drugs.   Family History:   family history includes CAD in his maternal grandfather and maternal grandmother; Diabetes in his mother; Heart attack in his brother; Hypertension in his mother; Other in his sister; Prostate cancer in his father.    Review of Systems: ROS   PHYSICAL EXAM: VS:  There were no vitals taken for this visit. , BMI There is no height or weight on file to calculate BMI. GEN: Well nourished, well developed, in no acute distress  HEENT: normal  Neck: no JVD, carotid bruits, or masses Cardiac: RRR; no murmurs, rubs, or gallops,no edema  Respiratory:  clear to auscultation bilaterally, normal work of breathing GI: soft, nontender, nondistended, + BS MS: no deformity or atrophy  Skin: warm and dry, no rash Neuro:  Strength and sensation are intact Psych: euthymic mood, full affect    Recent Labs: 08/26/2017: ALT 33 08/27/2017: BUN 14; Creatinine, Ser 0.91; Hemoglobin 14.3; Platelets 230; Potassium 4.0; Sodium 138    Lipid Panel Lab Results   Component Value Date   CHOL 145 08/26/2017   HDL 34 (L) 08/26/2017   LDLCALC 78 08/26/2017   TRIG 165 (H) 08/26/2017      Wt Readings from Last 3 Encounters:  08/26/17 245 lb 14.4 oz (111.5 kg)  06/10/15 235 lb (106.6 kg)       ASSESSMENT AND PLAN:  No diagnosis found.   Disposition:   F/U  6 months  No orders of the defined types were placed in this encounter.    Signed, Maxwell Davis, M.D., Ph.D. 10/10/2017  [facility] [place], [place] [phone]

## 2017-10-12 ENCOUNTER — Ambulatory Visit: Payer: BLUE CROSS/BLUE SHIELD | Admitting: Cardiovascular Disease

## 2017-10-12 DIAGNOSIS — I10 Essential (primary) hypertension: Secondary | ICD-10-CM | POA: Diagnosis not present

## 2017-10-12 DIAGNOSIS — R569 Unspecified convulsions: Secondary | ICD-10-CM | POA: Diagnosis not present

## 2017-10-12 DIAGNOSIS — G4733 Obstructive sleep apnea (adult) (pediatric): Secondary | ICD-10-CM | POA: Diagnosis not present

## 2017-10-12 DIAGNOSIS — E669 Obesity, unspecified: Secondary | ICD-10-CM | POA: Diagnosis not present

## 2017-10-19 ENCOUNTER — Ambulatory Visit: Payer: BLUE CROSS/BLUE SHIELD | Admitting: Cardiovascular Disease

## 2017-10-23 NOTE — Progress Notes (Signed)
Cardiology Office Note  Date:  10/25/2017   ID:  Maxwell DoughtyDavid K Davis, DOB Feb 05, 1966, MRN [id number]  PCP:  Mickey Farberhies, Aharon, MD   Chief Complaint  Patient presents with  . Other    [facility]; syncope & discuss event monitor. Meds reviewed by the pt. verbally. Pt. denies anymore syncope. Denies chest pain or shortness of breath.     HPI:  [name],  [name] morbid obesity presents to hospital 08/2017 secondary to syncopal episode. straining-had diarrhea/bowel movement prior to passing out. He presents today for follow-up of his syncope  Previously reported having   lower abdominal cramping, had a bowel movement, started to feel dizzy while on the toilet, transitioned himself to the floor then lost consciousness. Wife apparently heard a noise, found  him on the ground, called 911 Family also reports large amount of diarrhea on the ground around him.  Patient reports that he apparently "let loose" while on the ground  At the scene when EMTs arrived he was hypotensive systolic pressure in the 70s. was unable to roll over onto a stretcher as he felt very weak, required assistance Reports having relatively quick recovery on route to the emergency room and has felt well since then  He does eat out periodically, likes Mila HomerLa fiesta Reports having periodic constipation Family at the bedside reports he does not drink enough, maybe 2 small bottles of water a day   Elevated troponin- demand ischemia   Normal echo.  Stress test done was negative for any ischemia  Syncope-vasovagal syncope, triggered by hypotension and straining-had diarrhea/bowel movement prior to passing out.  . Carotid Dopplers without any hemodynamically significant stenosis.  Seizure disorder-stable.  Hasn't had seizure in more than 20 years. on Dilantin.  E does haveSleep apnea-CPAP at bedtime  Total chol137,  LDL 69  Feels well again today, back at work no near syncope or syncope and no GI issues  EKG personally reviewed by myself on todays visit Shows normal sinus rhythm rate 70 bpm no significant ST or T-wave changes   PMH:   has a past medical history of Essential hypertension, Hyperlipidemia, Morbid obesity (HCC), OSA (obstructive sleep apnea), and Seizures (HCC).  PSH:    Past Surgical History:  Procedure Laterality Date  . COLONOSCOPY WITH PROPOFOL N/A 06/10/2015   Procedure: COLONOSCOPY WITH PROPOFOL;  Surgeon: Christena DeemMartin U Skulskie, MD;  [facility];  Service: Endoscopy;  Laterality: N/A;  . right leg surgery     for tibial fracture    Current Outpatient Medications  Medication Sig Dispense Refill  . folic acid (FOLVITE) 400 MCG tablet Take 400 mcg by mouth daily.    . Glucosamine-Chondroitin (GLUCOSAMINE CHONDR COMPLEX PO) Take 1 tablet by mouth at bedtime.    Marland Kitchen. lisinopril (PRINIVIL,ZESTRIL) 20 MG tablet Take 20 mg by mouth daily.  2  . phenytoin (DILANTIN) 100 MG ER capsule Take 400 mg by mouth at bedtime.      No current facility-administered medications for this visit.      Allergies:   Levetiracetam and Penicillins   Social History:  The patient  reports that  has never smoked. he has never used smokeless tobacco. He reports that he does not drink alcohol or use drugs.   Family History:   family history includes CAD in his maternal grandfather and maternal grandmother; Diabetes in his mother; Heart attack in his brother; Hypertension in his mother; Other  in his sister; Prostate cancer in his father.    Review of Systems: Review of Systems  Constitutional: Negative.   Respiratory: Negative.   Cardiovascular: Negative.   Gastrointestinal: Negative.   Musculoskeletal: Negative.   Neurological: Negative.   Psychiatric/Behavioral: Negative.   All other systems reviewed and are negative.    PHYSICAL EXAM: VS:  BP 120/72 (BP Location: Left Arm, Patient  Position: Sitting, Cuff Size: Normal)   Pulse 70   Ht 5\' 10"  (1.778 m)   Wt 245 lb 8 oz (111.4 kg)   BMI 35.23 kg/m  , BMI Body mass index is 35.23 kg/m. GEN: Well nourished, well developed, in no acute distress  HEENT: normal  Neck: no JVD, carotid bruits, or masses Cardiac: RRR; no murmurs, rubs, or gallops,no edema  Respiratory:  clear to auscultation bilaterally, normal work of breathing GI: soft, nontender, nondistended, + BS MS: no deformity or atrophy  Skin: warm and dry, no rash Neuro:  Strength and sensation are intact Psych: euthymic mood, full affect    Recent Labs: 08/26/2017: ALT 33 08/27/2017: BUN 14; Creatinine, Ser 0.91; Hemoglobin 14.3; Platelets 230; Potassium 4.0; Sodium 138    Lipid Panel Lab Results  Component Value Date   CHOL 145 08/26/2017   HDL 34 (L) 08/26/2017   LDLCALC 78 08/26/2017   TRIG 165 (H) 08/26/2017      Wt Readings from Last 3 Encounters:  10/25/17 245 lb 8 oz (111.4 kg)  08/26/17 245 lb 14.4 oz (111.5 kg)  06/10/15 235 lb (106.6 kg)       ASSESSMENT AND PLAN:  Seizure disorder (HCC) - Plan: EKG 12-Lead Stable, managed by neurology  Vasovagal syncope - Plan: EKG 12-Lead After diarrhea, bowel movement found on the ground No further episodes No further testing needed  Diarrhea, unspecified type - Plan: EKG 12-Lead seems to be one-time event does not have chronic diarrhea  Elevated troponin - Plan: EKG 12-Lead Demand ischemia in the setting of hypotension syncope No further workup needed  Disposition:   F/U  As needed   Total encounter time more than 25 minutes  Greater than 50% was spent in counseling and coordination of care with the patient    Orders Placed This Encounter  Procedures  . EKG 12-Lead     Signed, Dossie Arbour, M.D., Ph.D. 10/25/2017  [facility] [place], [place] [phone]

## 2017-10-25 ENCOUNTER — Encounter: Payer: Self-pay | Admitting: Cardiovascular Disease

## 2017-10-25 ENCOUNTER — Ambulatory Visit: Payer: BLUE CROSS/BLUE SHIELD | Admitting: Cardiovascular Disease

## 2017-10-25 VITALS — BP 120/72 | HR 70 | Ht 70.0 in | Wt 245.5 lb

## 2017-10-25 DIAGNOSIS — R7989 Other specified abnormal findings of blood chemistry: Secondary | ICD-10-CM

## 2017-10-25 DIAGNOSIS — R748 Abnormal levels of other serum enzymes: Secondary | ICD-10-CM | POA: Diagnosis not present

## 2017-10-25 DIAGNOSIS — G40909 Epilepsy, unspecified, not intractable, without status epilepticus: Secondary | ICD-10-CM | POA: Diagnosis not present

## 2017-10-25 DIAGNOSIS — R778 Other specified abnormalities of plasma proteins: Secondary | ICD-10-CM

## 2017-10-25 DIAGNOSIS — R197 Diarrhea, unspecified: Secondary | ICD-10-CM

## 2017-10-25 DIAGNOSIS — R55 Syncope and collapse: Secondary | ICD-10-CM

## 2017-10-25 NOTE — Patient Instructions (Addendum)
Medication Instructions:   No medication changes made  Labwork:  No new labs needed  Testing/Procedures:  No further testing at this time   Follow-Up: It was a pleasure seeing you in the office today. Please call us if you have new issues that need to be addressed before your next appt.  [phone]  Your physician wants you to follow-up in: as needed You will receive a reminder letter in the mail two months in advance. If you don't receive a letter, please call our office to schedule the follow-up appointment.  If you need a refill on your cardiac medications before your next appointment, please call your pharmacy.  For educational health videos Log in to : [email or web address] Or : [email or web address], password : [email or web address]  

## 2017-12-16 ENCOUNTER — Encounter: Payer: Self-pay | Admitting: Internal Medicine

## 2017-12-16 ENCOUNTER — Ambulatory Visit: Payer: BLUE CROSS/BLUE SHIELD | Admitting: Internal Medicine

## 2017-12-16 VITALS — BP 122/74 | HR 70 | Ht 70.0 in | Wt 243.0 lb

## 2017-12-16 DIAGNOSIS — I4729 Other ventricular tachycardia: Secondary | ICD-10-CM

## 2017-12-16 DIAGNOSIS — I472 Ventricular tachycardia: Secondary | ICD-10-CM

## 2017-12-16 NOTE — Patient Instructions (Signed)

## 2017-12-16 NOTE — Progress Notes (Signed)
ELECTROPHYSIOLOGY CONSULT NOTE  Patient ID: Maxwell Davis, MRN: [id number], DOB/AGE: 53-Sep-1967 53 y.o. Admit date: (Not on file) Date of Consult: 12/16/2017  Primary Physician: Mickey Farber, MD Primary Cardiologist: Knute Neu     Maxwell Davis is a 52 y.o. male who is being seen today for the evaluation of VT- NS at the request of  TG.    HPI Maxwell Davis is a 52 y.o. male was admitted to hospital January 2019 following an episode of syncope.  This is occurred associated with defecation and abdominal pain.  EMS arrival found him to be very hypotensive.  Responded to fluids.  No arrhythmias were detected.  He has no antecedent history.  Because of the syncope, he underwent event recording.  Somewhat surprisingly, he had symptomatic episode of nonsustained ventricular tachycardia.  He was sitting at lunch with a friend; he had a sensation of a motor running in his chest for just a few moments.  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  He has no history of lightheadedness or syncope apart from the one episode.  It was unassociated with this motor sensation and was quite protracted in its onset, that is to say he was lightheaded for a period of time prior to losing consciousness on the bathroom floor  He has no complaints of exercise intolerance.  No orthpones pnd chest pain dyspnea on exertion or edema  He has treated sleep apnea and well controlled seizure disorder   DATE TEST EF   1/19 Myoview  52 % No ischemia  1/19 Echo  55-65 % No WMA          Past Medical History:  Diagnosis Date  . Essential hypertension   . Hyperlipidemia   . Morbid obesity (HCC)   . OSA (obstructive sleep apnea)   . Seizures (HCC)    a. On dilantin.  Last seizure 1995.      Surgical History:  Past Surgical History:  Procedure Laterality Date  . COLONOSCOPY WITH PROPOFOL N/A 06/10/2015   Procedure: COLONOSCOPY WITH PROPOFOL;   Surgeon: Christena Deem, MD;  Location: [facility] ENDOSCOPY;  Service: Endoscopy;  Laterality: N/A;  . right leg surgery     for tibial fracture     Home Meds: Prior to Admission medications   Medication Sig Start Date End Date Taking? Authorizing Provider  folic acid (FOLVITE) 400 MCG tablet Take 400 mcg by mouth daily.   Yes [provider]  Glucosamine-Chondroitin (GLUCOSAMINE CHONDR COMPLEX PO) Take 1 tablet by mouth at bedtime.   Yes [provider]  lisinopril (PRINIVIL,ZESTRIL) 10 MG tablet Take 1 tablet (10 mg total) by mouth daily. 10/25/17  Yes Antonieta Iba, MD  phenytoin (DILANTIN) 100 MG ER capsule Take 300 mg by mouth at bedtime.    Yes [provider]    Allergies:  Allergies  Allergen Reactions  . Levetiracetam Other (See Comments)    Irritable, mood swings  . Penicillins Rash    Has patient had a PCN reaction causing immediate rash, facial/tongue/throat swelling, SOB or lightheadedness with hypotension: Unknown Has patient had a PCN reaction causing severe rash involving mucus membranes or skin necrosis: Unknown Has patient had a PCN reaction that required hospitalization: No Has patient had a PCN reaction occurring within the last 10 years: No If all of the above answers are "NO", then may proceed with Cephalosporin use.  Social History   Socioeconomic History  . Marital status: Married    Spouse name: Not on file  . Number of children: Not on file  . Years of education: Not on file  . Highest education level: Not on file  Occupational History  . Not on file  Social Needs  . Financial resource strain: Not on file  . Food insecurity:    Worry: Not on file    Inability: Not on file  . Transportation needs:    Medical: Not on file    Non-medical: Not on file  Tobacco Use  . Smoking status: Never Smoker  . Smokeless tobacco: Never Used  Substance and Sexual Activity  . Alcohol use: No  . Drug use: No  . Sexual activity:  Not on file  Lifestyle  . Physical activity:    Days per week: Not on file    Minutes per session: Not on file  . Stress: Not on file  Relationships  . Social connections:    Talks on phone: Not on file    Gets together: Not on file    Attends religious service: Not on file    Active member of club or organization: Not on file    Attends meetings of clubs or organizations: Not on file    Relationship status: Not on file  . Intimate partner violence:    Fear of current or ex partner: Not on file    Emotionally abused: Not on file    Physically abused: Not on file    Forced sexual activity: Not on file  Other Topics Concern  . Not on file  Social History Narrative   Lives in [place] with his wife and dog.  He does not routinely exercise.     Family History  Problem Relation Age of Onset  . Diabetes Mother        in her 23's  . Hypertension Mother   . Prostate cancer Father        in his 66's  . CAD Maternal Grandmother   . CAD Maternal Grandfather   . Other Sister        alive and well  . Heart attack Brother        died in his 5's     ROS:  Please see the history of present illness.     All other systems reviewed and negative.    Physical Exam: Blood pressure 122/74, pulse 70, height  (1.778 m), weight 243 lb (110.2 kg), SpO2 97 %. General: Well developed, well nourished male in no acute distress. Head: Normocephalic, atraumatic, sclera non-icteric, no xanthomas, nares are without discharge. EENT: normal  Lymph Nodes:  none Neck: Negative for carotid bruits. JVD not elevated. Back:without scoliosis kyphosis Lungs: Clear bilaterally to auscultation without wheezes, rales, or rhonchi. Breathing is unlabored. Heart: RRR with S1 S2. No murmur . No rubs, or gallops appreciated. Abdomen: Soft, non-tender, non-distended with normoactive bowel sounds. No hepatomegaly. No rebound/guarding. No obvious abdominal masses. Msk:  Strength and tone appear normal for  age. Extremities: No clubbing or cyanosis  edema.  Distal pedal pulses are 2+ and equal bilaterally. Skin: Warm and Dry Neuro: Alert and oriented X 3. CN III-XII intact Grossly normal sensory and motor function . Psych:  Responds to questions appropriately with a normal affect.      Labs: Cardiac Enzymes No results for input(s): CKTOTAL, CKMB, TROPONINI in the last 72 hours. CBC Lab Results  Component Value Date  WBC 9.4 08/27/2017   HGB 14.3 08/27/2017   HCT 41.5 08/27/2017   MCV 89.2 08/27/2017   PLT 230 08/27/2017   PROTIME: No results for input(s): LABPROT, INR in the last 72 hours. Chemistry No results for input(s): NA, K, CL, CO2, BUN, CREATININE, CALCIUM, PROT, BILITOT, ALKPHOS, ALT, AST, GLUCOSE in the last 168 hours.  Invalid input(s): LABALBU Lipids Lab Results  Component Value Date   CHOL 145 08/26/2017   HDL 34 (L) 08/26/2017   LDLCALC 78 08/26/2017   TRIG 165 (H) 08/26/2017   BNP No results found for: PROBNP Thyroid Function Tests: No results for input(s): TSH, T4TOTAL, T3FREE, THYROIDAB in the last 72 hours.  Invalid input(s): FREET3 Miscellaneous No results found for: DDIMER  Radiology/Studies:  No results found.  EKG: sinus  15/09/41 Otherwise normal  Event Recorder personnally reviewed  Nonsustained ventricular tachycardia-9 beats associated with symptoms  Assessment and Plan:  Ventricular tachycardia-nonsustained  Syncope-vasovagal  Patient has nonsustained ventricular tachycardia in the context of a structurally normal heart as suggested by ECG, Myoview scanning as well as ECG.  There are no data of which I am aware suggesting that beta-blockers are more or less effective for nonsustained ventricular tachycardia, especially in the context of this being nonexertional.  There are data from 7 years ago looking at the value of ACE therapy ARB therapy in patients with nonsustained ventricular arrhythmias.  Hence, we will keep him on the  lisinopril.  It is unlikely that the arrhythmia served as a trigger for his vasovagal syncope.  He is advised regarding vasovagal syncope to be aware of the prodrome      Sherryl Manges

## 2018-04-06 DIAGNOSIS — R7309 Other abnormal glucose: Secondary | ICD-10-CM | POA: Diagnosis not present

## 2018-04-06 DIAGNOSIS — I10 Essential (primary) hypertension: Secondary | ICD-10-CM | POA: Diagnosis not present

## 2018-04-06 DIAGNOSIS — Z0000 Encounter for general adult medical examination without abnormal findings: Secondary | ICD-10-CM | POA: Diagnosis not present

## 2018-04-06 DIAGNOSIS — E782 Mixed hyperlipidemia: Secondary | ICD-10-CM | POA: Diagnosis not present

## 2018-04-19 DIAGNOSIS — E669 Obesity, unspecified: Secondary | ICD-10-CM | POA: Diagnosis not present

## 2018-04-19 DIAGNOSIS — G4733 Obstructive sleep apnea (adult) (pediatric): Secondary | ICD-10-CM | POA: Diagnosis not present

## 2018-04-19 DIAGNOSIS — R51 Headache: Secondary | ICD-10-CM | POA: Diagnosis not present

## 2018-04-19 DIAGNOSIS — I10 Essential (primary) hypertension: Secondary | ICD-10-CM | POA: Diagnosis not present

## 2018-04-20 DIAGNOSIS — M79672 Pain in left foot: Secondary | ICD-10-CM | POA: Diagnosis not present

## 2018-04-20 DIAGNOSIS — M76822 Posterior tibial tendinitis, left leg: Secondary | ICD-10-CM | POA: Diagnosis not present

## 2018-05-11 DIAGNOSIS — M76822 Posterior tibial tendinitis, left leg: Secondary | ICD-10-CM | POA: Diagnosis not present

## 2018-05-11 DIAGNOSIS — M79672 Pain in left foot: Secondary | ICD-10-CM | POA: Diagnosis not present

## 2018-10-18 DIAGNOSIS — G4733 Obstructive sleep apnea (adult) (pediatric): Secondary | ICD-10-CM | POA: Diagnosis not present

## 2018-10-18 DIAGNOSIS — I10 Essential (primary) hypertension: Secondary | ICD-10-CM | POA: Diagnosis not present

## 2018-10-18 DIAGNOSIS — R569 Unspecified convulsions: Secondary | ICD-10-CM | POA: Diagnosis not present

## 2018-11-04 DIAGNOSIS — R569 Unspecified convulsions: Secondary | ICD-10-CM | POA: Diagnosis not present

## 2018-12-23 DIAGNOSIS — R569 Unspecified convulsions: Secondary | ICD-10-CM | POA: Diagnosis not present

## 2019-04-11 DIAGNOSIS — I159 Secondary hypertension, unspecified: Secondary | ICD-10-CM | POA: Diagnosis not present

## 2019-04-11 DIAGNOSIS — Z8669 Personal history of other diseases of the nervous system and sense organs: Secondary | ICD-10-CM | POA: Diagnosis not present

## 2019-04-11 DIAGNOSIS — G4733 Obstructive sleep apnea (adult) (pediatric): Secondary | ICD-10-CM | POA: Diagnosis not present

## 2019-04-11 DIAGNOSIS — E669 Obesity, unspecified: Secondary | ICD-10-CM | POA: Diagnosis not present

## 2019-04-12 DIAGNOSIS — Z125 Encounter for screening for malignant neoplasm of prostate: Secondary | ICD-10-CM | POA: Diagnosis not present

## 2019-04-12 DIAGNOSIS — Z0000 Encounter for general adult medical examination without abnormal findings: Secondary | ICD-10-CM | POA: Diagnosis not present

## 2019-04-12 DIAGNOSIS — R7302 Impaired glucose tolerance (oral): Secondary | ICD-10-CM | POA: Diagnosis not present

## 2019-04-12 DIAGNOSIS — E782 Mixed hyperlipidemia: Secondary | ICD-10-CM | POA: Diagnosis not present

## 2019-04-12 DIAGNOSIS — Z79899 Other long term (current) drug therapy: Secondary | ICD-10-CM | POA: Diagnosis not present

## 2019-04-12 DIAGNOSIS — Z1159 Encounter for screening for other viral diseases: Secondary | ICD-10-CM | POA: Diagnosis not present

## 2019-12-20 DIAGNOSIS — T1512XA Foreign body in conjunctival sac, left eye, initial encounter: Secondary | ICD-10-CM | POA: Diagnosis not present

## 2020-01-02 IMAGING — US US CAROTID DUPLEX BILAT
1 series · 14 of 24 positions shown · non-contrast
Comparison: None.

CLINICAL DATA: Syncope

EXAM:
BILATERAL CAROTID DUPLEX ULTRASOUND
TECHNIQUE: Gray scale imaging, color Doppler and duplex ultrasound were
performed of bilateral carotid and vertebral arteries in the neck.

[Series 1: us carotid duplex bilat · 14 of 64 slices shown]
[im 1/64]
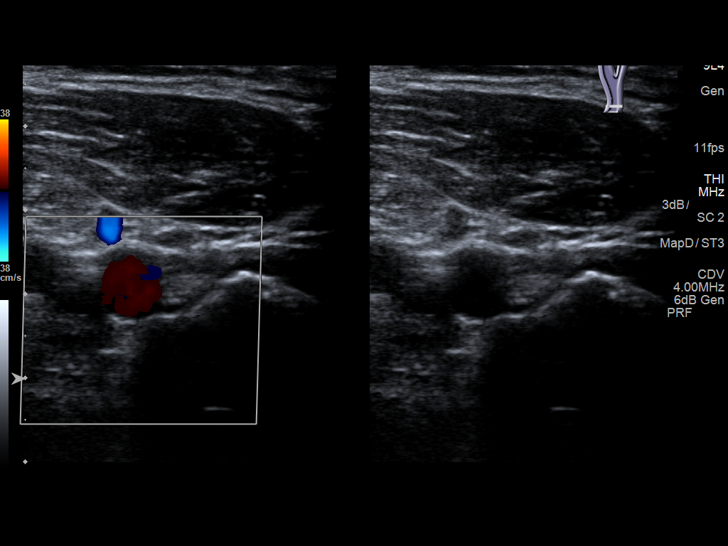
[im 6/64]
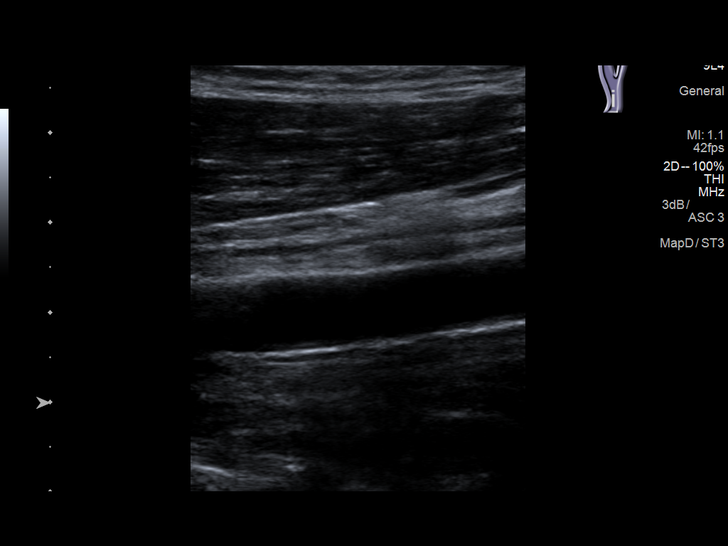
[im 11/64]
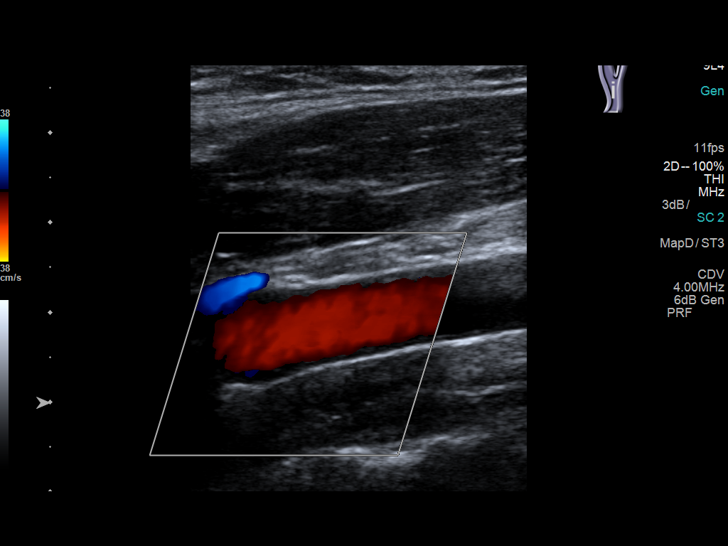
[im 17/64]
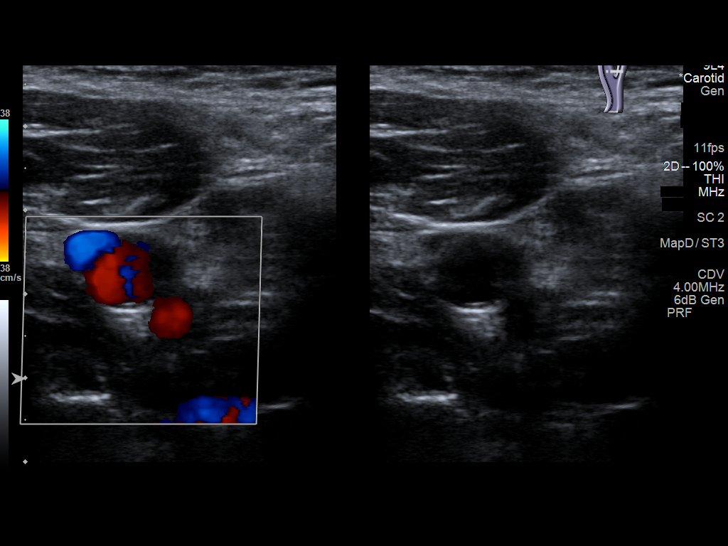
[im 20/64]
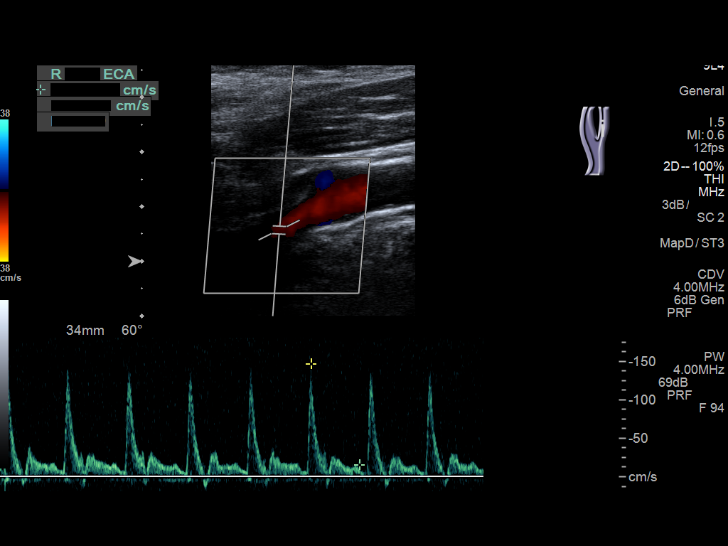
[im 25/64]
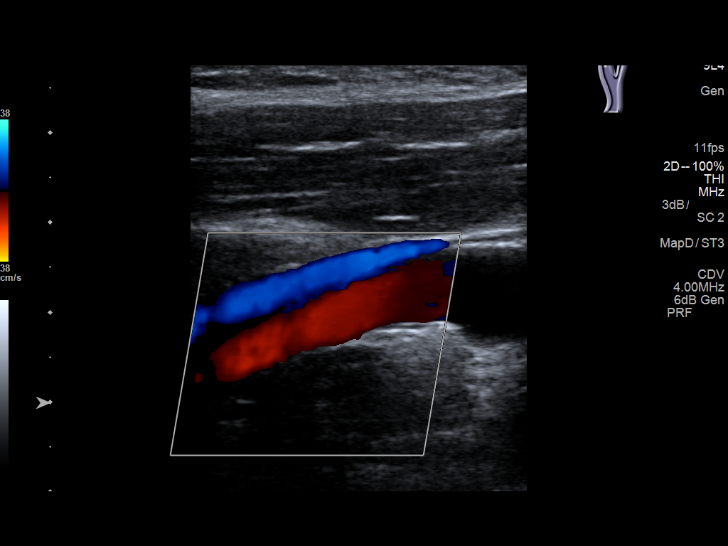
[im 31/64]
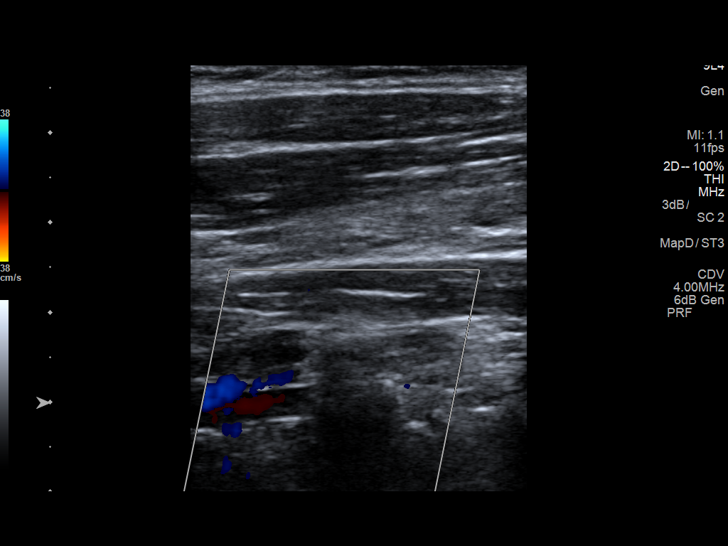
[im 33/64]
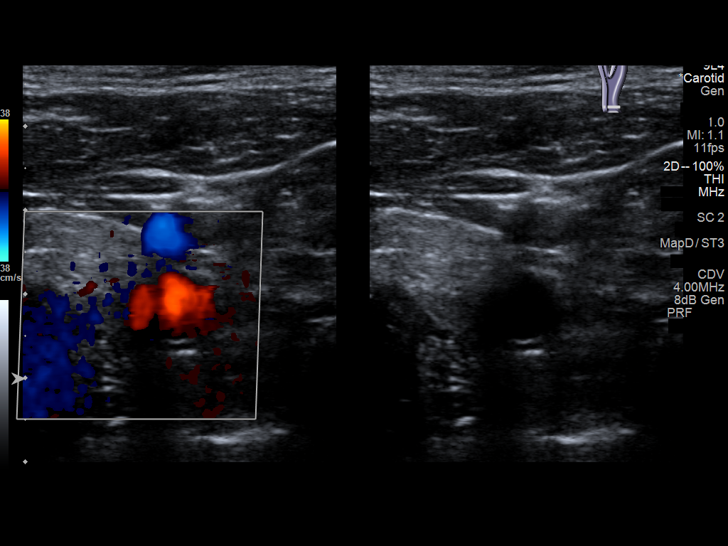
[im 39/64]
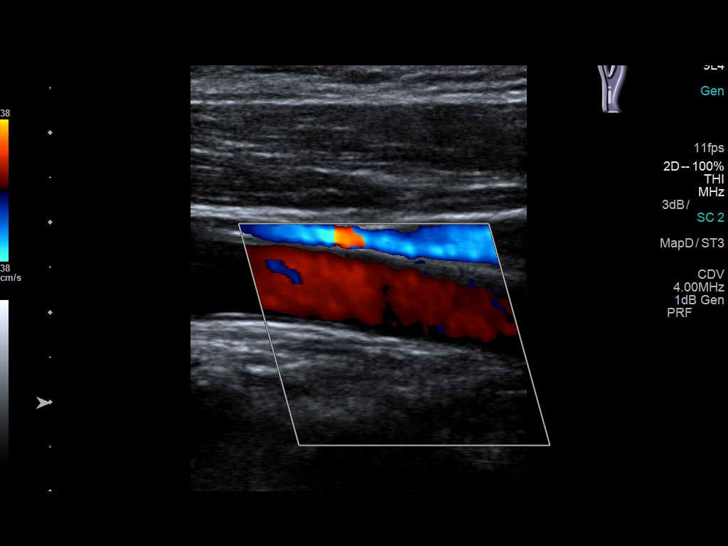
[im 44/64]
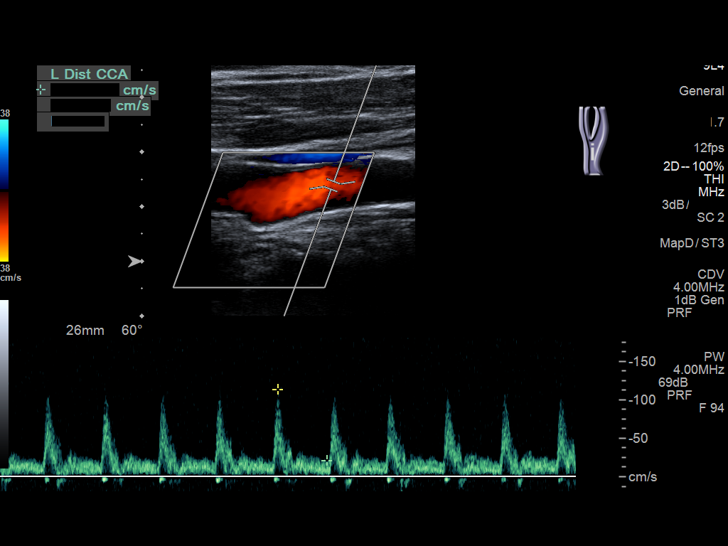
[im 50/64]
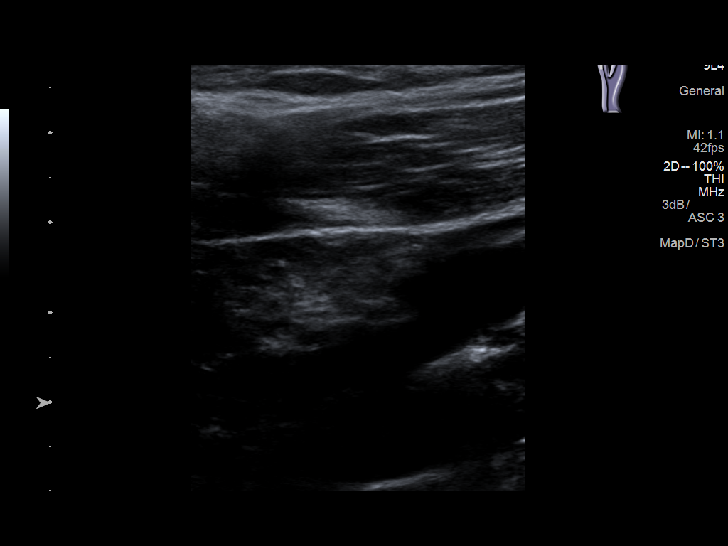
[im 53/64]
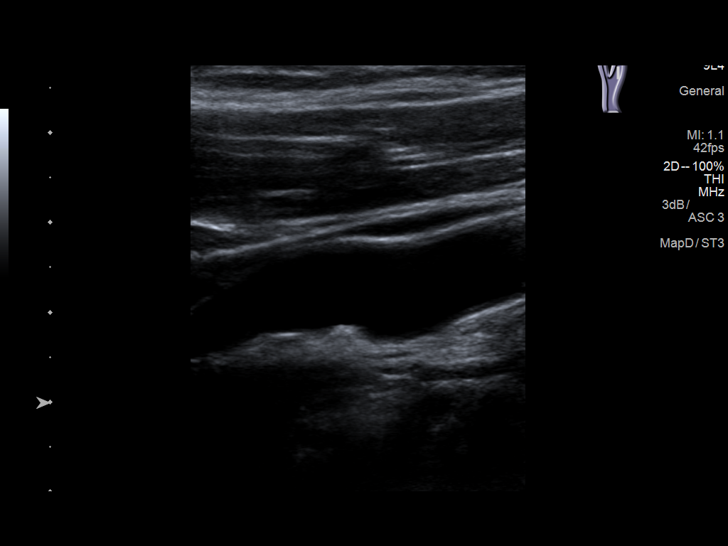
[im 58/64]
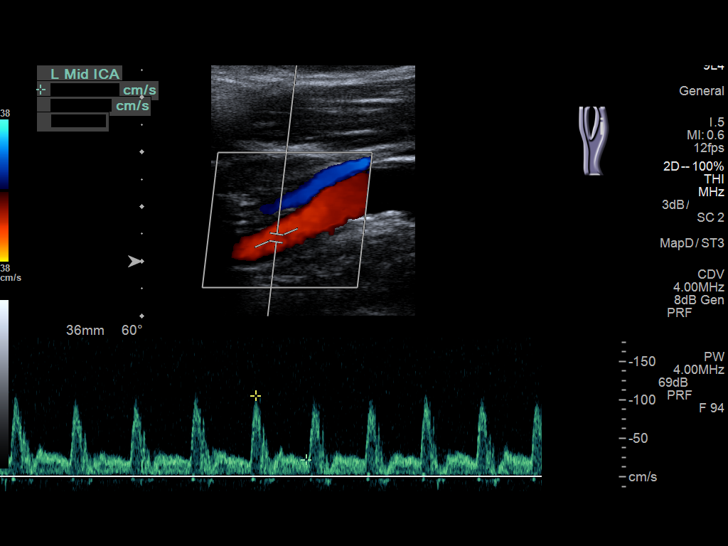
[im 64/64]
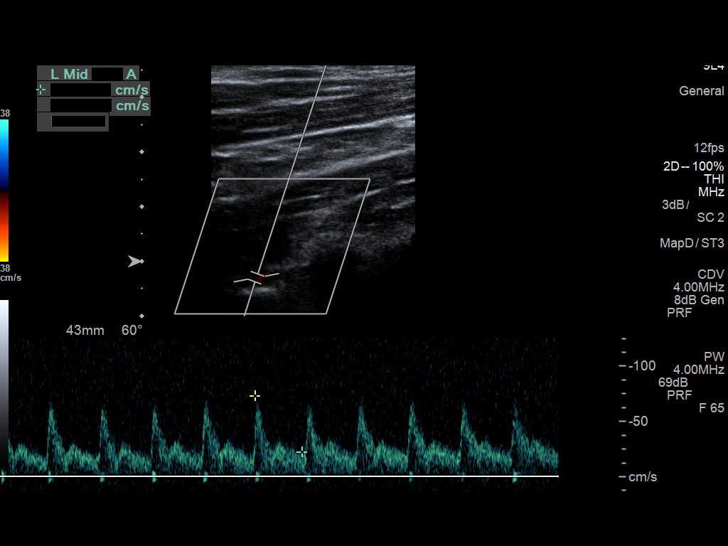

[14 of 24 positions shown; findings below may reference images not displayed]

FINDINGS: Criteria: Quantification of carotid stenosis is based on velocity
parameters that correlate the residual internal carotid diameter
with NASCET-based stenosis levels, using the diameter of the distal
internal carotid lumen as the denominator for stenosis measurement.

The following velocity measurements were obtained:

RIGHT

ICA:  116 cm/sec

CCA:  131 cm/sec

SYSTOLIC ICA/CCA RATIO:

DIASTOLIC ICA/CCA RATIO:

ECA:  147 cm/sec

LEFT

ICA:  106 cm/sec

CCA:  125 cm/sec

SYSTOLIC ICA/CCA RATIO:

DIASTOLIC ICA/CCA RATIO:

ECA:  96 cm/sec

RIGHT CAROTID ARTERY: Little if any plaque in the bulb. Low
resistance internal carotid Doppler pattern.

RIGHT VERTEBRAL ARTERY:  Antegrade.

LEFT CAROTID ARTERY: Little if any plaque in the bulb. Low
resistance internal carotid Doppler pattern.

LEFT VERTEBRAL ARTERY:  Antegrade.
IMPRESSION: Less than 50% stenosis in the right and left internal carotid
arteries.

## 2020-02-28 ENCOUNTER — Ambulatory Visit
Admission: EM | Admit: 2020-02-28 | Discharge: 2020-02-28 | Disposition: A | Discharge status: Home / Self Care | Payer: BC Managed Care – PPO | Attending: Family Medicine | Admitting: Family Medicine

## 2020-02-28 ENCOUNTER — Ambulatory Visit
Admit: 2020-02-28 | Discharge: 2020-02-28 | Disposition: A | Discharge status: Home / Self Care | Payer: BC Managed Care – PPO | Attending: Family Medicine | Admitting: Family Medicine

## 2020-02-28 ENCOUNTER — Other Ambulatory Visit: Payer: Self-pay

## 2020-02-28 DIAGNOSIS — Z880 Allergy status to penicillin: Secondary | ICD-10-CM | POA: Insufficient documentation

## 2020-02-28 DIAGNOSIS — G40909 Epilepsy, unspecified, not intractable, without status epilepticus: Secondary | ICD-10-CM | POA: Insufficient documentation

## 2020-02-28 DIAGNOSIS — Z79899 Other long term (current) drug therapy: Secondary | ICD-10-CM | POA: Diagnosis not present

## 2020-02-28 DIAGNOSIS — G4733 Obstructive sleep apnea (adult) (pediatric): Secondary | ICD-10-CM | POA: Diagnosis not present

## 2020-02-28 DIAGNOSIS — M793 Panniculitis, unspecified: Secondary | ICD-10-CM | POA: Diagnosis not present

## 2020-02-28 DIAGNOSIS — R509 Fever, unspecified: Secondary | ICD-10-CM | POA: Diagnosis not present

## 2020-02-28 DIAGNOSIS — R1010 Upper abdominal pain, unspecified: Secondary | ICD-10-CM | POA: Insufficient documentation

## 2020-02-28 DIAGNOSIS — K654 Sclerosing mesenteritis: Secondary | ICD-10-CM | POA: Diagnosis not present

## 2020-02-28 DIAGNOSIS — Z20822 Contact with and (suspected) exposure to covid-19: Secondary | ICD-10-CM | POA: Diagnosis not present

## 2020-02-28 DIAGNOSIS — I10 Essential (primary) hypertension: Secondary | ICD-10-CM | POA: Insufficient documentation

## 2020-02-28 DIAGNOSIS — K429 Umbilical hernia without obstruction or gangrene: Secondary | ICD-10-CM | POA: Diagnosis not present

## 2020-02-28 DIAGNOSIS — K760 Fatty (change of) liver, not elsewhere classified: Secondary | ICD-10-CM | POA: Insufficient documentation

## 2020-02-28 DIAGNOSIS — R05 Cough: Secondary | ICD-10-CM | POA: Diagnosis not present

## 2020-02-28 DIAGNOSIS — Z6835 Body mass index (BMI) 35.0-35.9, adult: Secondary | ICD-10-CM | POA: Insufficient documentation

## 2020-02-28 DIAGNOSIS — N200 Calculus of kidney: Secondary | ICD-10-CM | POA: Diagnosis not present

## 2020-02-28 DIAGNOSIS — N132 Hydronephrosis with renal and ureteral calculous obstruction: Secondary | ICD-10-CM | POA: Insufficient documentation

## 2020-02-28 DIAGNOSIS — E785 Hyperlipidemia, unspecified: Secondary | ICD-10-CM | POA: Diagnosis not present

## 2020-02-28 LAB — CBC WITH DIFFERENTIAL/PLATELET
Abs Immature Granulocytes: 0.02 10*3/uL (ref 0.00–0.07)
Basophils Absolute: 0 10*3/uL (ref 0.0–0.1)
Basophils Relative: 1 %
Eosinophils Absolute: 0.1 10*3/uL (ref 0.0–0.5)
Eosinophils Relative: 1 %
HCT: 42.6 % (ref 39.0–52.0)
Hemoglobin: 15 g/dL (ref 13.0–17.0)
Immature Granulocytes: 0 %
Lymphocytes Relative: 18 %
Lymphs Abs: 1.1 10*3/uL (ref 0.7–4.0)
MCH: 30 pg (ref 26.0–34.0)
MCHC: 35.2 g/dL (ref 30.0–36.0)
MCV: 85.2 fL (ref 80.0–100.0)
Monocytes Absolute: 0.8 10*3/uL (ref 0.1–1.0)
Monocytes Relative: 12 %
Neutro Abs: 4.2 10*3/uL (ref 1.7–7.7)
Neutrophils Relative %: 68 %
Platelets: 204 10*3/uL (ref 150–400)
RBC: 5 MIL/uL (ref 4.22–5.81)
RDW: 12.2 % (ref 11.5–15.5)
WBC: 6.1 10*3/uL (ref 4.0–10.5)
nRBC: 0 % (ref 0.0–0.2)

## 2020-02-28 LAB — SARS CORONAVIRUS 2 AG (30 MIN TAT): SARS Coronavirus 2 Ag: NEGATIVE

## 2020-02-28 LAB — COMPREHENSIVE METABOLIC PANEL
ALT: 63 U/L — ABNORMAL HIGH (ref 0–44)
AST: 51 U/L — ABNORMAL HIGH (ref 15–41)
Albumin: 4.1 g/dL (ref 3.5–5.0)
Alkaline Phosphatase: 62 U/L (ref 38–126)
Anion gap: 13 (ref 5–15)
BUN: 12 mg/dL (ref 6–20)
CO2: 18 mmol/L — ABNORMAL LOW (ref 22–32)
Calcium: 8.5 mg/dL — ABNORMAL LOW (ref 8.9–10.3)
Chloride: 103 mmol/L (ref 98–111)
Creatinine, Ser: 0.95 mg/dL (ref 0.61–1.24)
GFR calc Af Amer: >60 mL/min (ref >60)
GFR calc non Af Amer: >60 mL/min (ref >60)
Glucose, Bld: 147 mg/dL — ABNORMAL HIGH (ref 70–99)
Potassium: 4.3 mmol/L (ref 3.5–5.1)
Sodium: 134 mmol/L — ABNORMAL LOW (ref 135–145)
Total Bilirubin: 0.9 mg/dL (ref 0.3–1.2)
Total Protein: 7.6 g/dL (ref 6.5–8.1)

## 2020-02-28 LAB — LIPASE, BLOOD: Lipase: 21 U/L (ref 11–51)

## 2020-02-28 MED ORDER — CIPROFLOXACIN HCL 500 MG PO TABS
500.0000 mg | ORAL_TABLET | Freq: Two times a day (BID) | ORAL | 0 refills | Status: DC
Start: 2020-02-28 — End: 2020-04-02

## 2020-02-28 MED ORDER — METRONIDAZOLE 500 MG PO TABS
500.0000 mg | ORAL_TABLET | Freq: Two times a day (BID) | ORAL | 0 refills | Status: DC
Start: 2020-02-28 — End: 2020-04-02

## 2020-02-28 NOTE — ED Triage Notes (Signed)
Patient complains of cough with upper abdominal pain since Sunday. Reports that he has been having some sinus drainage and fever that has remained constant since the onset. Denies any body aches. Has not had Covid Vaccines.

## 2020-02-28 NOTE — ED Provider Notes (Signed)
[facility]    CSN: [id number] Arrival date & time: 02/28/20  0805   History   Chief Complaint Chief Complaint  Patient presents with  . Cough   HPI  54 year old male presents with abdominal pain, fever.  Patient reports that his symptoms started on Sunday.  He reports upper abdominal pain and fever.  Temperature currently 100.2.  Patient reports ongoing sinus drainage and cough.  He states that this is common for him.  He attributes this to postnasal drip.  Denies body aches.  No reported sick contacts.  Patient states that his abdominal pain worsens after eating.  He states that his appetite has been decreased.  He has had some nausea and one episode of emesis.  No relieving factors.  No other associated symptoms.  No other complaints.  Past Medical History:  Diagnosis Date  . Essential hypertension   . Hyperlipidemia   . Morbid obesity (HCC)   . OSA (obstructive sleep apnea)   . Seizures (HCC)    a. On dilantin.  Last seizure 1995.   Patient Active Problem List   Diagnosis Date Noted  . Elevated troponin 08/26/2017  . Vasovagal syncope 08/26/2017  . Diarrhea 08/26/2017  . Seizure disorder (HCC) 08/26/2017   Past Surgical History:  Procedure Laterality Date  . COLONOSCOPY WITH PROPOFOL N/A 06/10/2015   Procedure: COLONOSCOPY WITH PROPOFOL;  Surgeon: Christena Deem, MD;  Location: [facility] ENDOSCOPY;  Service: Endoscopy;  Laterality: N/A;  . right leg surgery     for tibial fracture   Home Medications    Prior to Admission medications   Medication Sig Start Date End Date Taking? Authorizing Provider  lisinopril (PRINIVIL,ZESTRIL) 10 MG tablet Take 1 tablet (10 mg total) by mouth daily. 10/25/17  Yes Gollan, Tollie Pizza, MD  ciprofloxacin (CIPRO) 500 MG tablet Take 1 tablet (500 mg total) by mouth 2 (two) times daily. 02/28/20   Tommie Sams, DO  metroNIDAZOLE (FLAGYL) 500 MG tablet Take 1 tablet (500 mg total) by mouth 2 (two) times daily. 02/28/20   Tommie Sams, DO  phenytoin (DILANTIN) 100 MG ER capsule Take 300 mg by mouth at bedtime.   02/28/20  [provider]    Family History Family History  Problem Relation Age of Onset  . Diabetes Mother        in her 50's  . Hypertension Mother   . Prostate cancer Father        in his 60's  . CAD Maternal Grandmother   . CAD Maternal Grandfather   . Other Sister        alive and well  . Heart attack Brother        died in his 47's    Social History Social History   Tobacco Use  . Smoking status: Never Smoker  . Smokeless tobacco: Never Used  Vaping Use  . Vaping Use: Never used  Substance Use Topics  . Alcohol use: No  . Drug use: No     Allergies   Levetiracetam and Penicillins   Review of Systems Review of Systems  Constitutional: Positive for fever.  HENT: Positive for postnasal drip.   Respiratory: Positive for cough.   Gastrointestinal: Positive for abdominal pain, nausea and vomiting.   Physical Exam Triage Vital Signs ED Triage Vitals  Enc Vitals Group     BP 02/28/20 0831 (!) 139/91     Pulse Rate 02/28/20 0831 (!) 105     Resp 02/28/20 0831 19  Temp 02/28/20 0831 100.2 F (37.9 C)     Temp Source 02/28/20 0831 Oral     SpO2 02/28/20 0831 98 %     Weight 02/28/20 0834 250 lb (113.4 kg)     Height 02/28/20 0834 5\' 10"  (1.778 m)     Head Circumference --      Peak Flow --      Pain Score 02/28/20 0833 1     Pain Loc --      Pain Edu? --      Excl. in GC? --    Updated Vital Signs BP (!) 139/91 (BP Location: Left Arm)   Pulse (!) 105   Temp 100.2 F (37.9 C) (Oral)   Resp 19   Ht 5\' 10"  (1.778 m)   Wt 113.4 kg   SpO2 98%   BMI 35.87 kg/m   Visual Acuity Right Eye Distance:   Left Eye Distance:   Bilateral Distance:    Right Eye Near:   Left Eye Near:    Bilateral Near:     Physical Exam Vitals and nursing note reviewed.  Constitutional:      General: He is not in acute distress.    Appearance: Normal appearance. He is obese.  He is not ill-appearing.  HENT:     Head: Normocephalic and atraumatic.  Eyes:     General:        Right eye: No discharge.        Left eye: No discharge.     Conjunctiva/sclera: Conjunctivae normal.  Cardiovascular:     Rate and Rhythm: Regular rhythm. Tachycardia present.  Pulmonary:     Effort: Pulmonary effort is normal.     Breath sounds: Normal breath sounds. No wheezing, rhonchi or rales.  Abdominal:     General: There is no distension.     Palpations: Abdomen is soft.     Comments: Mild tenderness in the epigastric region.  No other discrete areas of tenderness.  No rebound or guarding.  Neurological:     Mental Status: He is alert.  Psychiatric:        Mood and Affect: Mood normal.        Behavior: Behavior normal.    UC Treatments / Results  Labs (all labs ordered are listed, but only abnormal results are displayed) Labs Reviewed  COMPREHENSIVE METABOLIC PANEL - Abnormal; Notable for the following components:      Result Value   Sodium 134 (*)    CO2 18 (*)    Glucose, Bld 147 (*)    Calcium 8.5 (*)    AST 51 (*)    ALT 63 (*)    All other components within normal limits  SARS CORONAVIRUS 2 AG (30 MIN TAT)  CBC WITH DIFFERENTIAL/PLATELET  LIPASE, BLOOD    EKG   Radiology CT ABDOMEN PELVIS WO CONTRAST  Result Date: 02/28/2020 CLINICAL DATA:  Epigastric abdominal pain, nausea and vomiting. EXAM: CT ABDOMEN AND PELVIS WITHOUT CONTRAST TECHNIQUE: Multidetector CT imaging of the abdomen and pelvis was performed following the standard protocol without IV contrast. COMPARISON:  None. FINDINGS: Lower chest: The lung bases are clear of acute process. No pleural effusion or pulmonary lesions. The heart is normal in size. No pericardial effusion. The distal esophagus and aorta are unremarkable. Hepatobiliary: Diffuse and fairly marked fatty infiltration of the liver. No focal hepatic lesions are identified. Focal fatty sparing noted around the gallbladder fossa.  Gallbladder is grossly normal by CT. No findings to suggest  acute cholecystitis. No common bile duct dilatation. Pancreas: No mass, inflammation or ductal dilatation. Prominent fatty interstices. Spleen: Mild splenomegaly. The spleen measures 14 x 12 x 9.5 cm. No focal lesions. Adrenals/Urinary Tract: The adrenal glands are normal. Midpole right renal calculus but no obstructing right ureteral calculus. Moderate left-sided hydronephrosis probably due to a mild chronic UPJ obstruction. No left-sided renal or obstructing ureteral calculi. The bladder is unremarkable. No bladder mass or asymmetric bladder wall thickening. No bladder calculi. Stomach/Bowel: The stomach, duodenum, small bowel and colon are grossly normal without oral contrast. No acute inflammatory process, mass lesions or obstructive findings. The terminal ileum is normal. The appendix is normal. Vascular/Lymphatic: The aorta is normal in caliber. No atherosclerotic calcifications. Numerous scattered mesenteric lymph nodes and hazy interstitial changes in the root of the small bowel mesentery typically seen with benign mesenteritis or panniculitis. Small scattered retroperitoneal lymph nodes are also noted. Reproductive: The prostate gland and seminal vesicles are unremarkable. Other: Small scattered pelvic lymph nodes but no adenopathy. No free pelvic fluid collections. No inguinal mass, hernia or adenopathy. Small periumbilical abdominal wall hernia containing fat. Musculoskeletal: No significant bony findings. IMPRESSION: 1. Diffuse and fairly marked fatty infiltration of the liver. 2. Mild splenomegaly. 3. Numerous scattered mesenteric lymph nodes and hazy interstitial changes in the root of the small bowel mesentery typically seen with benign mesenteritis or panniculitis. 4. Right renal calculus but no obstructing ureteral calculi. 5. Probable mild chronic left UPJ obstruction. 6. No acute abdominal/pelvic findings, mass lesions or adenopathy.  Electronically Signed   By: Rudie MeyerP.  Gallerani M.D.   On: 02/28/2020 11:39    Procedures Procedures (including critical care time)  Medications Ordered in UC Medications - No data to display  Initial Impression / Assessment and Plan / UC Course  I have reviewed the triage vital signs and the nursing notes.  Pertinent labs & imaging results that were available during my care of the patient were reviewed by me and considered in my medical decision making (see chart for details).    54 year old male presents with epigastric abdominal pain and fever.  He has some respiratory symptoms as well.  Rapid Covid negative.  CBC normal.  CMP notable for CO2 of 18, elevated LFTs and elevated glucose.  Given laboratory abnormalities as well as persistent fever and abdominal pain I am sending him for stat CT of the abdomen pelvis.  CT returned with multiple findings.  Fatty liver, splenomegaly, scattered mesenteric lymph nodes and interstitial changes at the root of the small bowel mesentery suggestive of mesenteritis versus panniculitis.  Right renal calculus also noted as well as suspected chronic left UPJ obstruction.  I am placing the patient empirically on Cipro and Flagyl to cover for possible intra-abdominal infection.  Advised close follow-up with primary care physician.  Needs very close follow-up given findings.  Final Clinical Impressions(s) / UC Diagnoses   Final diagnoses:  Upper abdominal pain  Fever, unspecified fever cause     Discharge Instructions     [facility] to the Medical mall.  I will call with the results.  Take care  Dr. Adriana Simasook     ED Prescriptions    Medication Sig Dispense Auth. Provider   ciprofloxacin (CIPRO) 500 MG tablet Take 1 tablet (500 mg total) by mouth 2 (two) times daily. 14 tablet Cierah Crader G, DO   metroNIDAZOLE (FLAGYL) 500 MG tablet Take 1 tablet (500 mg total) by mouth 2 (two) times daily. 14 tablet [place], [place] G, [place]  PDMP not reviewed this encounter.     Tommie Sams, [place] 02/28/20 1203

## 2020-02-28 NOTE — ED Notes (Signed)
Patient has been approved for CPT [id number]. Obtained via [employer] via [employer] . Valid for 02/28/2020 through 08/25/2020. Authorization # [id number].

## 2020-02-28 NOTE — Discharge Instructions (Signed)
Go to the Medical mall.  I will call with the results.  Take care  Dr. Adriana Simas

## 2020-02-29 DIAGNOSIS — K760 Fatty (change of) liver, not elsewhere classified: Secondary | ICD-10-CM | POA: Diagnosis not present

## 2020-02-29 DIAGNOSIS — I10 Essential (primary) hypertension: Secondary | ICD-10-CM | POA: Diagnosis not present

## 2020-02-29 DIAGNOSIS — R1013 Epigastric pain: Secondary | ICD-10-CM | POA: Diagnosis not present

## 2020-02-29 DIAGNOSIS — R7989 Other specified abnormal findings of blood chemistry: Secondary | ICD-10-CM | POA: Diagnosis not present

## 2020-03-04 DIAGNOSIS — R1013 Epigastric pain: Secondary | ICD-10-CM | POA: Diagnosis not present

## 2020-03-04 DIAGNOSIS — K760 Fatty (change of) liver, not elsewhere classified: Secondary | ICD-10-CM | POA: Diagnosis not present

## 2020-03-04 DIAGNOSIS — R7989 Other specified abnormal findings of blood chemistry: Secondary | ICD-10-CM | POA: Diagnosis not present

## 2020-04-02 ENCOUNTER — Other Ambulatory Visit: Payer: Self-pay

## 2020-04-02 ENCOUNTER — Ambulatory Visit: Payer: BC Managed Care – PPO | Admitting: Internal Medicine

## 2020-04-02 ENCOUNTER — Encounter: Payer: Self-pay | Admitting: Internal Medicine

## 2020-04-02 VITALS — BP 160/108 | HR 77 | Ht 70.0 in | Wt 260.8 lb

## 2020-04-02 DIAGNOSIS — I472 Ventricular tachycardia: Secondary | ICD-10-CM

## 2020-04-02 DIAGNOSIS — R55 Syncope and collapse: Secondary | ICD-10-CM | POA: Diagnosis not present

## 2020-04-02 DIAGNOSIS — I4729 Other ventricular tachycardia: Secondary | ICD-10-CM

## 2020-04-02 NOTE — Patient Instructions (Addendum)
Medication Instructions:  - Resume Lisinopril (zestril)  *If you need a refill on your cardiac medications before your next appointment, please call your pharmacy*   Lab Work: - none ordered  If you have labs (blood work) drawn today and your tests are completely normal, you will receive your results only by: Marland Kitchen MyChart Message (if you have MyChart) OR . A paper copy in the mail If you have any lab test that is abnormal or we need to change your treatment, we will call you to review the results.   Testing/Procedures: - none ordered   Follow-Up: At [facility], you and your health needs are our priority.  As part of our continuing mission to provide you with exceptional heart care, we have created designated Provider Care Teams.  These Care Teams include your primary Cardiologist (physician) and Advanced Practice Providers (APPs -  Physician Assistants and Nurse Practitioners) who all work together to provide you with the care you need, when you need it.  We recommend signing up for the patient portal called "MyChart".  Sign up information is provided on this After Visit Summary.  MyChart is used to connect with patients for Virtual Visits (Telemedicine).  Patients are able to view lab/test results, encounter notes, upcoming appointments, etc.  Non-urgent messages can be sent to your provider as well.   To learn more about what you can do with MyChart, go to [email or web address].    Your next appointment:   1 year(s)  The format for your next appointment:   In Person  Provider:   Sherryl Manges, MD   Other Instructions n/a

## 2020-04-02 NOTE — Progress Notes (Signed)
      Patient Care Team: Mickey Farber, MD as PCP - General (Internal Medicine) Antonieta Iba, MD as PCP - Cardiology (Cardiology)   HPI  Maxwell Davis is a 54 y.o. 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 Essential hypertension   . Hyperlipidemia   . Morbid obesity (HCC)   . OSA (obstructive sleep apnea)   . Seizures (HCC)    a. On dilantin.  Last seizure 1995.    Past Surgical History:  Procedure Laterality Date  . COLONOSCOPY WITH PROPOFOL N/A 06/10/2015   Procedure: COLONOSCOPY WITH PROPOFOL;  Surgeon: Christena Deem, MD;  Location: [facility] ENDOSCOPY;  Service: Endoscopy;  Laterality: N/A;  . right leg surgery     for tibial fracture    Current Meds  Medication Sig  . lisinopril (PRINIVIL,ZESTRIL) 10 MG tablet Take 1 tablet (10 mg total) by mouth daily.    Allergies  Allergen Reactions  . Levetiracetam Other (See Comments)    Irritable, mood swings  . Penicillins Rash    Has patient had a PCN reaction causing immediate rash, facial/tongue/throat swelling, SOB or lightheadedness with hypotension: Unknown Has patient had a PCN reaction causing severe rash involving mucus membranes or skin necrosis: Unknown Has patient had a PCN reaction that required hospitalization: No Has patient had a PCN reaction occurring within the last 10 years: No If all of the above answers are "NO", then may proceed with Cephalosporin use.       Review of Systems negative except from HPI and PMH  Physical Exam BP (!) 160/108 (BP Location: Left Arm, Patient Position: Sitting, Cuff Size: Normal)   Pulse 77    Ht 5\' 10"  (1.778 m)   Wt 260 lb 12.8 oz (118.3 kg)   SpO2 97%   BMI 37.42 kg/m  Well developed and nourished in no acute distress HENT normal Neck supple with JVP-  Flat  Clear Regular rate and rhythm, no murmurs or gallops Abd-soft with active BS No Clubbing cyanosis edema Skin-warm and dry A & Oriented  Grossly normal sensory and motor function     ECG  Sinus @ 77   CrCl cannot be calculated (Patient's most recent lab result is older than the maximum 21 days allowed.).   Assessment and  Plan  Ventricular tachycardia-nonsustained  Syncope-vasovagal  Hypertension  Sleep apnea treated   Interval syncope off the commode. With warning   Stressed the importance of prodrome as warning   BP is elevated but had not taken med   Sleep apnea treated but card has not been read (?ever?) Have asked him to followup with his PCP with whom he has appt  This may help his BP iof Cpap could be optimized  Current medicines are reviewed at length with the patient today .  The patient does not  have concerns regarding medicines.

## 2020-04-12 DIAGNOSIS — K760 Fatty (change of) liver, not elsewhere classified: Secondary | ICD-10-CM | POA: Diagnosis not present

## 2020-04-12 DIAGNOSIS — Z1388 Encounter for screening for disorder due to exposure to contaminants: Secondary | ICD-10-CM | POA: Diagnosis not present

## 2020-04-12 DIAGNOSIS — R7302 Impaired glucose tolerance (oral): Secondary | ICD-10-CM | POA: Diagnosis not present

## 2020-04-12 DIAGNOSIS — Z0000 Encounter for general adult medical examination without abnormal findings: Secondary | ICD-10-CM | POA: Diagnosis not present

## 2020-04-12 DIAGNOSIS — Z79899 Other long term (current) drug therapy: Secondary | ICD-10-CM | POA: Diagnosis not present

## 2020-04-12 DIAGNOSIS — E782 Mixed hyperlipidemia: Secondary | ICD-10-CM | POA: Diagnosis not present

## 2021-06-05 ENCOUNTER — Ambulatory Visit: Payer: BC Managed Care – PPO | Admitting: Internal Medicine

## 2021-06-05 ENCOUNTER — Encounter: Payer: Self-pay | Admitting: Internal Medicine

## 2021-06-05 ENCOUNTER — Other Ambulatory Visit: Payer: Self-pay

## 2021-06-05 VITALS — BP 138/90 | HR 63 | Ht 70.0 in | Wt 264.0 lb

## 2021-06-05 DIAGNOSIS — R55 Syncope and collapse: Secondary | ICD-10-CM

## 2021-06-05 DIAGNOSIS — G4730 Sleep apnea, unspecified: Secondary | ICD-10-CM | POA: Diagnosis not present

## 2021-06-05 DIAGNOSIS — I10 Essential (primary) hypertension: Secondary | ICD-10-CM | POA: Diagnosis not present

## 2021-06-05 DIAGNOSIS — I4729 Other ventricular tachycardia: Secondary | ICD-10-CM

## 2021-06-05 NOTE — Patient Instructions (Signed)
Medication Instructions:  - Your physician recommends that you continue on your current medications as directed. Please refer to the Current Medication list given to you today.  *If you need a refill on your cardiac medications before your next appointment, please call your pharmacy*   Lab Work: - none ordered  If you have labs (blood work) drawn today and your tests are completely normal, you will receive your results only by: MyChart Message (if you have MyChart) OR A paper copy in the mail If you have any lab test that is abnormal or we need to change your treatment, we will call you to review the results.   Testing/Procedures: - You have been referred to : [facility]- Sleep Apnea (to assume care of C-PAP management)  > Their office will contact you directly to schedule an appointment, but if you have not heard from them within 2 weeks, please call [phone] to follow up.     Follow-Up: At [facility], you and your health needs are our priority.  As part of our continuing mission to provide you with exceptional heart care, we have created designated Provider Care Teams.  These Care Teams include your primary Cardiologist (physician) and Advanced Practice Providers (APPs -  Physician Assistants and Nurse Practitioners) who all work together to provide you with the care you need, when you need it.  We recommend signing up for the patient portal called "MyChart".  Sign up information is provided on this After Visit Summary.  MyChart is used to connect with patients for Virtual Visits (Telemedicine).  Patients are able to view lab/test results, encounter notes, upcoming appointments, etc.  Non-urgent messages can be sent to your provider as well.   To learn more about what you can do with MyChart, go to [email or web address].    Your next appointment:   1 year(s)  The format for your next appointment:   In Person  Provider:   Sherryl Manges, MD   Other  Instructions N/a

## 2021-06-05 NOTE — Progress Notes (Signed)
      Patient Care Team: Mickey Farber, MD (Inactive) as PCP - General (Internal Medicine) Antonieta Iba, MD as PCP - Cardiology (Cardiology)   HPI  Maxwell Davis is a 55 y.o. male Seen in followup for syncope, presumed neurally mediated, and VT NS Also HTN, Morbidly obese and treated sleep apnea   No interval syncope.  Has had a couple of episodes when he feels abdominal cramping which has been part of his prodromal complex; interestingly, when he stands to go to the bathroom there is no aggravation of symptoms  Rx sleep apnea but not sure card has ever been read    Works Press photographer  Records and Results Reviewed   Past Medical History:  Diagnosis Date   Essential hypertension    Hyperlipidemia    Morbid obesity (HCC)    OSA (obstructive sleep apnea)    Seizures (HCC)    a. On dilantin.  Last seizure 1995.    Past Surgical History:  Procedure Laterality Date   COLONOSCOPY WITH PROPOFOL N/A 06/10/2015   Procedure: COLONOSCOPY WITH PROPOFOL;  Surgeon: Christena Deem, MD;  Location: [facility] ENDOSCOPY;  Service: Endoscopy;  Laterality: N/A;   right leg surgery     for tibial fracture    Current Meds  Medication Sig   lisinopril (PRINIVIL,ZESTRIL) 10 MG tablet Take 1 tablet (10 mg total) by mouth daily.   loratadine (CLARITIN) 10 MG tablet Take by mouth.    Allergies  Allergen Reactions   Levetiracetam Other (See Comments)    Irritable, mood swings   Penicillins Rash    Has patient had a PCN reaction causing immediate rash, facial/tongue/throat swelling, SOB or lightheadedness with hypotension: Unknown Has patient had a PCN reaction causing severe rash involving mucus membranes or skin necrosis: Unknown Has patient had a PCN reaction that required hospitalization: No Has patient had a PCN reaction occurring within the last 10 years: No If all of the above answers are "NO", then may proceed with Cephalosporin use.       Review of Systems negative  except from HPI and PMH  Physical Exam BP 138/90 (BP Location: Left Arm, Patient Position: Sitting, Cuff Size: Large)   Pulse 63   Ht 5\' 10"  (1.778 m)   Wt 264 lb (119.7 kg)   SpO2 95%   BMI 37.88 kg/m  Well developed and nourished in no acute distress HENT normal Neck supple with JVP-  flat   Clear Regular rate and rhythm, no murmurs or gallops Abd-soft with active BS No Clubbing cyanosis edema Skin-warm and dry A & Oriented  Grossly normal sensory and motor function  ECG sinus at 63 Interval 15/09/42  CrCl cannot be calculated (Patient's most recent lab result is older than the maximum 21 days allowed.).   Assessment and  Plan  Ventricular tachycardia-nonsustained   Syncope-vasovagal  Hypertension  Sleep apnea treated   Blood pressure borderline elevated; rather than treated more aggressively, will wait until we get the sleep apnea issue resolved.  For now continue lisinopril 10  He needs further evaluation of his sleep apnea.  Will refer to pulmonary

## 2021-06-24 ENCOUNTER — Telehealth: Payer: Self-pay

## 2021-06-24 NOTE — Telephone Encounter (Signed)
Pt bring sd card to upcoming appt, nothing further needed.

## 2021-06-24 NOTE — Progress Notes (Signed)
@Patient  ID: , male    DOB: 05/08/1966, 55 y.o.   MRN: [id number]  No chief complaint on file.   Referring provider: [id number], MD  HPI: 55 year old male, never smoked. PMH significant for HTN, NSVT, seizure disorder, sleep apnea, morbid obesity.   06/25/2021- Interim hx  Patient presents today for sleep consult, referred by Dr. 06/27/2021 with cardiology for hx sleep apnea. Appears he has been on CPAP for several years, he reports compliance with use. No data on SD card. From what I gather he had repeat sleep study in 2016 with [facility], we will need to request this results. Previously managed by Dr. 2017 who is now retired. He wears CPAP on average 5-6 hours a night. He is unsure pressure setting. He uses full face mask. He typically sleeps through the night without issue. He uses [employer] dream station machine which was recalled, he was contacted by the company in October 2022 to get some more information. Denies narcolepsy, cataplexy or sleep walking.   Compliance review from Phone - Nov 7th 2022 Usage 5 hours 40 min Pressure 12-17cm h20 (Average pressure 12cm) AHI 3.7  Sleep questionnaire Prior sleep study- Sleep study in 2016 with [facility]  Symptoms- Snoring, restless sleep, daytime sleepiness Bedtime- 9pm-12am Time to fall asleep- not long Nocturnal awakenings- none Out of bed in morning- 5am Weight changes- gained 10 lbs  Epworth- 11  Allergies  Allergen Reactions   Levetiracetam Other (See Comments)    Irritable, mood swings   Penicillins Rash    Has patient had a PCN reaction causing immediate rash, facial/tongue/throat swelling, SOB or lightheadedness with hypotension: Unknown Has patient had a PCN reaction causing severe rash involving mucus membranes or skin necrosis: Unknown Has patient had a PCN reaction that required hospitalization: No Has patient had a PCN reaction occurring within the last 10 years: No If all of the above answers  are "NO", then may proceed with Cephalosporin use.     Immunization History  Administered Date(s) Administered   Tdap 03/27/2015    Past Medical History:  Diagnosis Date   Essential hypertension    Hyperlipidemia    Morbid obesity (HCC)    OSA (obstructive sleep apnea)    Seizures (HCC)    a. On dilantin.  Last seizure 1995.    Tobacco History: Social History   Tobacco Use  Smoking Status Never  Smokeless Tobacco Never   Counseling given: Not Answered   Outpatient Medications Prior to Visit  Medication Sig Dispense Refill   lisinopril (PRINIVIL,ZESTRIL) 10 MG tablet Take 1 tablet (10 mg total) by mouth daily.     loratadine (CLARITIN) 10 MG tablet Take by mouth.     No facility-administered medications prior to visit.    Review of Systems  Review of Systems  Constitutional:  Positive for fatigue.  HENT: Negative.    Respiratory: Negative.    Cardiovascular: Negative.   Psychiatric/Behavioral:  Positive for sleep disturbance.     Physical Exam  BP 116/70 (BP Location: Left Arm, Patient Position: Sitting, Cuff Size: Normal)   Pulse 72   Temp 97.8 F (36.6 C) (Oral)   Ht 5\' 10"  (1.778 m)   Wt 268 lb 3.2 oz (121.7 kg)   SpO2 99%   BMI 38.48 kg/m  Physical Exam Constitutional:      Appearance: Normal appearance.  HENT:     Head: Normocephalic and atraumatic.  Cardiovascular:     Rate and Rhythm: Normal  rate and regular rhythm.  Pulmonary:     Effort: Pulmonary effort is normal.     Breath sounds: Normal breath sounds.  Musculoskeletal:        General: Normal range of motion.  Skin:    General: Skin is warm and dry.  Neurological:     General: No focal deficit present.     Mental Status: He is alert and oriented to person, place, and time. Mental status is at baseline.  Psychiatric:        Mood and Affect: Mood normal.        Behavior: Behavior normal.        Thought Content: Thought content normal.        Judgment: Judgment normal.     Lab  Results:  CBC    Component Value Date/Time   WBC 6.1 02/28/2020 0854   RBC 5.00 02/28/2020 0854   HGB 15.0 02/28/2020 0854   HCT 42.6 02/28/2020 0854   PLT 204 02/28/2020 0854   MCV 85.2 02/28/2020 0854   MCH 30.0 02/28/2020 0854   MCHC 35.2 02/28/2020 0854   RDW 12.2 02/28/2020 0854   LYMPHSABS 1.1 02/28/2020 0854   MONOABS 0.8 02/28/2020 0854   EOSABS 0.1 02/28/2020 0854   BASOSABS 0.0 02/28/2020 0854    BMET    Component Value Date/Time   NA 134 (L) 02/28/2020 0854   K 4.3 02/28/2020 0854   CL 103 02/28/2020 0854   CO2 18 (L) 02/28/2020 0854   GLUCOSE 147 (H) 02/28/2020 0854   BUN 12 02/28/2020 0854   CREATININE 0.95 02/28/2020 0854   CALCIUM 8.5 (L) 02/28/2020 0854   GFRNONAA >60 02/28/2020 0854   GFRAA >60 02/28/2020 0854    BNP No results found for: BNP  ProBNP No results found for: PROBNP  Imaging: No results found.   Assessment & Plan:   Obstructive sleep apnea on CPAP - Patient has hx sleep apnea on CPAP. Previously managed by Dr. Harrington Challenger who has retired. He had repeat sleep study in 2016 with [facility], results are not available in Epic. He reports compliance with CPAP use, no issues. SD card had no information. Compliance report from his phone was reviewed. Current pressure settings are 12-17cm h20 (average 12cm h20) with AHI typically <5/hr. No changed recommended at this time. We will request sleep study results and compliance report from [facility]. He is in the process of getting new CPAP machine from [employer] d/t recall. Follow-up in 3 months.    Glenford Bayley, NP 06/25/2021

## 2021-06-25 ENCOUNTER — Other Ambulatory Visit: Payer: Self-pay

## 2021-06-25 ENCOUNTER — Encounter: Payer: Self-pay | Admitting: Primary Care

## 2021-06-25 ENCOUNTER — Ambulatory Visit (INDEPENDENT_AMBULATORY_CARE_PROVIDER_SITE_OTHER): Payer: BC Managed Care – PPO | Admitting: Primary Care

## 2021-06-25 DIAGNOSIS — Z9989 Dependence on other enabling machines and devices: Secondary | ICD-10-CM

## 2021-06-25 DIAGNOSIS — G4733 Obstructive sleep apnea (adult) (pediatric): Secondary | ICD-10-CM | POA: Insufficient documentation

## 2021-06-25 NOTE — Progress Notes (Signed)
Reviewed and agree with assessment/plan.   Coralyn Helling, MD [facility] Pulmonary/Critical Care 06/25/2021, 11:13 AM Pager:  [phone]

## 2021-06-25 NOTE — Patient Instructions (Addendum)
Based on your CPAP compliance report from your phone looks like current pressure settings are working well for you. No need to change at this time.   Recommendations: Continue to wear CPAP every night for 4-6 hours or longer Do not drive if experiencing excessive daytime sleepiness Maintain healthy weight   Orders: Please request sleep study results from Dr. Harrington Challenger office or [facility] along with download  Renew CPAP supplies with [facility]   Follow-up: 3 months with [facility] NP    Sleep Apnea Sleep apnea affects breathing during sleep. It causes breathing to stop for 10 seconds or more, or to become shallow. People with sleep apnea usually snore loudly. It can also increase the risk of: Heart attack. Stroke. Being very overweight (obese). Diabetes. Heart failure. Irregular heartbeat. High blood pressure. The goal of treatment is to help you breathe normally again. What are the causes? The most common cause of this condition is a collapsed or blocked airway. There are three kinds of sleep apnea: Obstructive sleep apnea. This is caused by a blocked or collapsed airway. Central sleep apnea. This happens when the brain does not send the right signals to the muscles that control breathing. Mixed sleep apnea. This is a combination of obstructive and central sleep apnea. What increases the risk? Being overweight. Smoking. Having a small airway. Being older. Being male. Drinking alcohol. Taking medicines to calm yourself (sedatives or tranquilizers). Having family members with the condition. Having a tongue or tonsils that are larger than normal. What are the signs or symptoms? Trouble staying asleep. Loud snoring. Headaches in the morning. Waking up gasping. Dry mouth or sore throat in the morning. Being sleepy or tired during the day. If you are sleepy or tired during the day, you may also: Not be able to focus your mind (concentrate). Forget things. Get angry a lot  and have mood swings. Feel sad (depressed). Have changes in your personality. Have less interest in sex, if you are male. Be unable to have an erection, if you are male. How is this treated?  Sleeping on your side. Using a medicine to get rid of mucus in your nose (decongestant). Avoiding the use of alcohol, medicines to help you relax, or certain pain medicines (narcotics). Losing weight, if needed. Changing your diet. Quitting smoking. Using a machine to open your airway while you sleep, such as: An oral appliance. This is a mouthpiece that shifts your lower jaw forward. A CPAP device. This device blows air through a mask when you breathe out (exhale). An EPAP device. This has valves that you put in each nostril. A BIPAP device. This device blows air through a mask when you breathe in (inhale) and breathe out. Having surgery if other treatments do not work. Follow these instructions at home: Lifestyle Make changes that your doctor recommends. Eat a healthy diet. Lose weight if needed. Avoid alcohol, medicines to help you relax, and some pain medicines. Do not smoke or use any products that contain nicotine or tobacco. If you need help quitting, ask your doctor. General instructions Take over-the-counter and prescription medicines only as told by your doctor. If you were given a machine to use while you sleep, use it only as told by your doctor. If you are having surgery, make sure to tell your doctor you have sleep apnea. You may need to bring your device with you. Keep all follow-up visits. Contact a doctor if: The machine that you were given to use during sleep bothers you  or does not seem to be working. You do not get better. You get worse. Get help right away if: Your chest hurts. You have trouble breathing in enough air. You have an uncomfortable feeling in your back, arms, or stomach. You have trouble talking. One side of your body feels weak. A part of your face is  hanging down. These symptoms may be an emergency. Get help right away. Call your local emergency services (911 in the [place] Do not wait to see if the symptoms will go away. Do not drive yourself to the hospital. Summary This condition affects breathing during sleep. The most common cause is a collapsed or blocked airway. The goal of treatment is to help you breathe normally while you sleep. This information is not intended to replace advice given to you by your health care provider. Make sure you discuss any questions you have with your health care provider. Document Revised: 03/05/2021 Document Reviewed: 07/05/2020 Elsevier Patient Education  2022 [employer].

## 2021-06-25 NOTE — Assessment & Plan Note (Addendum)
-   Patient has hx sleep apnea on CPAP. Previously managed by Dr. Harrington Challenger who has retired. He had repeat sleep study in 2016 with [facility], results are not available in Epic. He reports compliance with CPAP use, no issues. SD card had no information. Compliance report from his phone was reviewed. Current pressure settings are 12-17cm h20 (average 12cm h20) with AHI typically <5/hr. No changed recommended at this time. We will request sleep study results and compliance report from [facility]. He is in the process of getting new CPAP machine from [employer] d/t recall. Follow-up in 3 months.

## 2021-09-22 ENCOUNTER — Ambulatory Visit: Payer: BC Managed Care – PPO | Admitting: Primary Care

## 2021-09-23 ENCOUNTER — Telehealth: Payer: Self-pay | Admitting: Primary Care

## 2021-09-23 NOTE — Telephone Encounter (Signed)
Spoke to patient and requested that he bring SD card to visit.  °

## 2021-09-24 ENCOUNTER — Encounter: Payer: Self-pay | Admitting: Primary Care

## 2021-09-24 ENCOUNTER — Ambulatory Visit: Payer: BC Managed Care – PPO | Admitting: Primary Care

## 2021-09-24 ENCOUNTER — Other Ambulatory Visit: Payer: Self-pay

## 2021-09-24 DIAGNOSIS — Z9989 Dependence on other enabling machines and devices: Secondary | ICD-10-CM

## 2021-09-24 DIAGNOSIS — G4733 Obstructive sleep apnea (adult) (pediatric): Secondary | ICD-10-CM

## 2021-09-24 NOTE — Progress Notes (Signed)
Reviewed and agree with assessment/plan.   Chesley Mires, MD [facility] Pulmonary/Critical Care 09/24/2021, 1:01 PM Pager:  [phone]

## 2021-09-24 NOTE — Patient Instructions (Addendum)
Recommendations: Continue to wear CPAP every night for 4-6 hours or longer Continue to work on weight loss efforts  Please bring SD card by [facility] for compliance download (they will fax report to [place])  Follow-up: 6 months with Waynetta Sandy NP

## 2021-09-24 NOTE — Assessment & Plan Note (Addendum)
-   Sleep study in 2016 at [facility] showed severe OSA, AHI 55.9/hr with SpO2 low 89% - Reviewed compliance on patient's [employer] wear phone app. Average usage 5 hours 30 mins.  - Current pressure 12-17cm h20 (15cm h20-90%); residual AHI 4.6. Mask seal was poor  - Advised patient to bring SD card by [facility] for download - Needs new CPAP machine at current settings d/t [employer] recall  - FU in 6 months or sooner if needed

## 2021-09-24 NOTE — Progress Notes (Signed)
@Patient  ID: , male    DOB: Jan 03, 1966, 56 y.o.   MRN: [id number]  Chief Complaint  Patient presents with   Follow-up    Wearing cpap avg 5-6hr nightly-feels pressure and mask is okay.     Referring provider: No ref. provider found  HPI: 56 year old male, never smoked. PMH significant for HTN, NSVT, seizure disorder, sleep apnea, morbid obesity.   Previous [facility] pulmonary encounter:  06/25/2021 Patient presents today for sleep consult, referred by Dr. 06/27/2021 with cardiology for hx sleep apnea. Appears he has been on CPAP for several years, he reports compliance with use. No data on SD card. From what I gather he had repeat sleep study in 2016 with [facility], we will need to request this results. Previously managed by Dr. 2017 who is now retired. He wears CPAP on average 5-6 hours a night. He is unsure pressure setting. He uses full face mask. He typically sleeps through the night without issue. He uses [employer] dream station machine which was recalled, he was contacted by the company in October 2022 to get some more information. Denies narcolepsy, cataplexy or sleep walking.   Compliance review from Phone - Nov 7th 2022 Usage 5 hours 40 min Pressure 12-17cm h20 (Average pressure 12cm) AHI 3.7  Sleep questionnaire Prior sleep study- Sleep study in 2016 with [facility]  Symptoms- Snoring, restless sleep, daytime sleepiness Bedtime- 9pm-12am Time to fall asleep- not long Nocturnal awakenings- none Out of bed in morning- 5am Weight changes- gained 10 lbs  [facility]- 11  09/24/2021- Interim hx  Patient presents today for 3 month follow-up. Sleep apnea was previously managed by Dr. 09/26/2021. He established with our office in November 2022 for OSA. He had a sleep study in 2016 at [facility] which showed severe OSA, AHI 55.9/hr with SpO2 low 89%. Patient reports compliance with CPAP use. No issues with mask fit or pressure setting. He brought in SD card with him but unable  to access download. Average usage on [employer] dream wear app was 5 hours 30 mins. Current pressure setting 12-17cm h20 (15cm h20-90th%); AHI 4.6. Mask seal score was poor. Advised he change out current mask. CPAP machine was recalled by [employer], he has not received a new one.  Needs to bring SD card by [facility] for compliance download.   Allergies  Allergen Reactions   Levetiracetam Other (See Comments)    Irritable, mood swings   Penicillins Rash    Has patient had a PCN reaction causing immediate rash, facial/tongue/throat swelling, SOB or lightheadedness with hypotension: Unknown Has patient had a PCN reaction causing severe rash involving mucus membranes or skin necrosis: Unknown Has patient had a PCN reaction that required hospitalization: No Has patient had a PCN reaction occurring within the last 10 years: No If all of the above answers are "NO", then may proceed with Cephalosporin use.     Immunization History  Administered Date(s) Administered   Tdap 03/27/2015    Past Medical History:  Diagnosis Date   Essential hypertension    Hyperlipidemia    Morbid obesity (HCC)    OSA (obstructive sleep apnea)    Seizures (HCC)    a. On dilantin.  Last seizure 1995.    Tobacco History: Social History   Tobacco Use  Smoking Status Never  Smokeless Tobacco Never   Counseling given: Not Answered   Outpatient Medications Prior to Visit  Medication Sig Dispense Refill   lisinopril (PRINIVIL,ZESTRIL) 10 MG tablet Take  1 tablet (10 mg total) by mouth daily.     loratadine (CLARITIN) 10 MG tablet Take by mouth.     No facility-administered medications prior to visit.    Review of Systems  Review of Systems  Constitutional: Negative.   HENT: Negative.    Respiratory: Negative.    Cardiovascular: Negative.     Physical Exam  BP (!) 142/84 (BP Location: Left Arm, Cuff Size: Large)    Pulse 79    Temp 97.8 F (36.6 C) (Temporal)    Ht 5\' 10"  (1.778 m)    Wt 273 lb  3.2 oz (123.9 kg)    SpO2 99%    BMI 39.20 kg/m  Physical Exam Constitutional:      Appearance: Normal appearance.  HENT:     Head: Normocephalic and atraumatic.     Mouth/Throat:     Mouth: Mucous membranes are moist.     Pharynx: Oropharynx is clear.  Cardiovascular:     Rate and Rhythm: Normal rate and regular rhythm.  Pulmonary:     Effort: Pulmonary effort is normal.     Breath sounds: Normal breath sounds.  Musculoskeletal:        General: Normal range of motion.  Skin:    General: Skin is warm and dry.  Neurological:     General: No focal deficit present.     Mental Status: He is alert and oriented to person, place, and time. Mental status is at baseline.  Psychiatric:        Mood and Affect: Mood normal.        Behavior: Behavior normal.        Thought Content: Thought content normal.        Judgment: Judgment normal.     Lab Results:  CBC    Component Value Date/Time   WBC 6.1 02/28/2020 0854   RBC 5.00 02/28/2020 0854   HGB 15.0 02/28/2020 0854   HCT 42.6 02/28/2020 0854   PLT 204 02/28/2020 0854   MCV 85.2 02/28/2020 0854   MCH 30.0 02/28/2020 0854   MCHC 35.2 02/28/2020 0854   RDW 12.2 02/28/2020 0854   LYMPHSABS 1.1 02/28/2020 0854   MONOABS 0.8 02/28/2020 0854   EOSABS 0.1 02/28/2020 0854   BASOSABS 0.0 02/28/2020 0854    BMET    Component Value Date/Time   NA 134 (L) 02/28/2020 0854   K 4.3 02/28/2020 0854   CL 103 02/28/2020 0854   CO2 18 (L) 02/28/2020 0854   GLUCOSE 147 (H) 02/28/2020 0854   BUN 12 02/28/2020 0854   CREATININE 0.95 02/28/2020 0854   CALCIUM 8.5 (L) 02/28/2020 0854   GFRNONAA >60 02/28/2020 0854   GFRAA >60 02/28/2020 0854    BNP No results found for: BNP  ProBNP No results found for: PROBNP  Imaging: No results found.   Assessment & Plan:   Obstructive sleep apnea on CPAP - Sleep study in 2016 at [facility] showed severe OSA, AHI 55.9/hr with SpO2 low 89% - Reviewed compliance on patient's [employer]  dream wear phone app. Average usage 5 hours 30 mins.  - Current pressure 12-17cm h20 (15cm h20-90%); residual AHI 4.6. Mask seal was poor  - Advised patient to bring SD card by [facility] for download - Needs new CPAP machine at current settings d/t [employer] recall  - FU in 6 months or sooner if needed    2017, NP 09/24/2021

## 2022-01-07 DIAGNOSIS — H5203 Hypermetropia, bilateral: Secondary | ICD-10-CM | POA: Diagnosis not present

## 2022-01-07 DIAGNOSIS — H52223 Regular astigmatism, bilateral: Secondary | ICD-10-CM | POA: Diagnosis not present

## 2022-01-07 DIAGNOSIS — R7309 Other abnormal glucose: Secondary | ICD-10-CM | POA: Diagnosis not present

## 2022-01-07 DIAGNOSIS — H35033 Hypertensive retinopathy, bilateral: Secondary | ICD-10-CM | POA: Diagnosis not present

## 2022-02-18 ENCOUNTER — Observation Stay
Admission: EM | Admit: 2022-02-18 | Discharge: 2022-02-19 | Disposition: A | Discharge status: Home / Self Care | Payer: BC Managed Care – PPO | Attending: Internal Medicine | Admitting: Internal Medicine

## 2022-02-18 ENCOUNTER — Observation Stay: Payer: BC Managed Care – PPO

## 2022-02-18 ENCOUNTER — Emergency Department: Payer: BC Managed Care – PPO

## 2022-02-18 ENCOUNTER — Other Ambulatory Visit: Payer: Self-pay

## 2022-02-18 DIAGNOSIS — R778 Other specified abnormalities of plasma proteins: Secondary | ICD-10-CM | POA: Diagnosis not present

## 2022-02-18 DIAGNOSIS — Z79899 Other long term (current) drug therapy: Secondary | ICD-10-CM | POA: Diagnosis not present

## 2022-02-18 DIAGNOSIS — Z20822 Contact with and (suspected) exposure to covid-19: Secondary | ICD-10-CM | POA: Diagnosis not present

## 2022-02-18 DIAGNOSIS — R55 Syncope and collapse: Secondary | ICD-10-CM | POA: Diagnosis not present

## 2022-02-18 DIAGNOSIS — G4733 Obstructive sleep apnea (adult) (pediatric): Secondary | ICD-10-CM

## 2022-02-18 DIAGNOSIS — R7989 Other specified abnormal findings of blood chemistry: Secondary | ICD-10-CM | POA: Diagnosis present

## 2022-02-18 DIAGNOSIS — R911 Solitary pulmonary nodule: Secondary | ICD-10-CM | POA: Diagnosis not present

## 2022-02-18 DIAGNOSIS — I10 Essential (primary) hypertension: Secondary | ICD-10-CM | POA: Diagnosis not present

## 2022-02-18 DIAGNOSIS — R7402 Elevation of levels of lactic acid dehydrogenase (LDH): Secondary | ICD-10-CM | POA: Diagnosis not present

## 2022-02-18 DIAGNOSIS — Z9989 Dependence on other enabling machines and devices: Secondary | ICD-10-CM

## 2022-02-18 DIAGNOSIS — R0689 Other abnormalities of breathing: Secondary | ICD-10-CM | POA: Diagnosis not present

## 2022-02-18 DIAGNOSIS — E876 Hypokalemia: Secondary | ICD-10-CM | POA: Diagnosis not present

## 2022-02-18 DIAGNOSIS — R680 Hypothermia, not associated with low environmental temperature: Secondary | ICD-10-CM | POA: Diagnosis not present

## 2022-02-18 DIAGNOSIS — A419 Sepsis, unspecified organism: Secondary | ICD-10-CM | POA: Diagnosis not present

## 2022-02-18 DIAGNOSIS — R197 Diarrhea, unspecified: Secondary | ICD-10-CM | POA: Insufficient documentation

## 2022-02-18 DIAGNOSIS — T68XXXA Hypothermia, initial encounter: Secondary | ICD-10-CM | POA: Diagnosis present

## 2022-02-18 DIAGNOSIS — D72829 Elevated white blood cell count, unspecified: Secondary | ICD-10-CM | POA: Insufficient documentation

## 2022-02-18 DIAGNOSIS — I959 Hypotension, unspecified: Secondary | ICD-10-CM | POA: Diagnosis not present

## 2022-02-18 DIAGNOSIS — E669 Obesity, unspecified: Secondary | ICD-10-CM | POA: Diagnosis present

## 2022-02-18 DIAGNOSIS — E785 Hyperlipidemia, unspecified: Secondary | ICD-10-CM | POA: Diagnosis present

## 2022-02-18 DIAGNOSIS — R0902 Hypoxemia: Secondary | ICD-10-CM | POA: Diagnosis not present

## 2022-02-18 DIAGNOSIS — I5A Non-ischemic myocardial injury (non-traumatic): Secondary | ICD-10-CM | POA: Insufficient documentation

## 2022-02-18 DIAGNOSIS — I4729 Other ventricular tachycardia: Secondary | ICD-10-CM | POA: Diagnosis present

## 2022-02-18 LAB — CBC WITH DIFFERENTIAL/PLATELET
Abs Immature Granulocytes: 0.06 10*3/uL (ref 0.00–0.07)
Basophils Absolute: 0 10*3/uL (ref 0.0–0.1)
Basophils Relative: 0 %
Eosinophils Absolute: 0.2 10*3/uL (ref 0.0–0.5)
Eosinophils Relative: 2 %
HCT: 43.8 % (ref 39.0–52.0)
Hemoglobin: 14.6 g/dL (ref 13.0–17.0)
Immature Granulocytes: 1 %
Lymphocytes Relative: 31 %
Lymphs Abs: 3.5 10*3/uL (ref 0.7–4.0)
MCH: 29.3 pg (ref 26.0–34.0)
MCHC: 33.3 g/dL (ref 30.0–36.0)
MCV: 87.8 fL (ref 80.0–100.0)
Monocytes Absolute: 0.3 10*3/uL (ref 0.1–1.0)
Monocytes Relative: 3 %
Neutro Abs: 7.2 10*3/uL (ref 1.7–7.7)
Neutrophils Relative %: 63 %
Platelets: 278 10*3/uL (ref 150–400)
RBC: 4.99 MIL/uL (ref 4.22–5.81)
RDW: 12 % (ref 11.5–15.5)
WBC: 11.3 10*3/uL — ABNORMAL HIGH (ref 4.0–10.5)
nRBC: 0 % (ref 0.0–0.2)

## 2022-02-18 LAB — COMPREHENSIVE METABOLIC PANEL
ALT: 54 U/L — ABNORMAL HIGH (ref 0–44)
AST: 36 U/L (ref 15–41)
Albumin: 4.3 g/dL (ref 3.5–5.0)
Alkaline Phosphatase: 60 U/L (ref 38–126)
Anion gap: 13 (ref 5–15)
BUN: 18 mg/dL (ref 6–20)
CO2: 26 mmol/L (ref 22–32)
Calcium: 9.5 mg/dL (ref 8.9–10.3)
Chloride: 102 mmol/L (ref 98–111)
Creatinine, Ser: 1.19 mg/dL (ref 0.61–1.24)
GFR, Estimated: >60 mL/min (ref >60)
Glucose, Bld: 214 mg/dL — ABNORMAL HIGH (ref 70–99)
Potassium: 3.4 mmol/L — ABNORMAL LOW (ref 3.5–5.1)
Sodium: 141 mmol/L (ref 135–145)
Total Bilirubin: 0.8 mg/dL (ref 0.3–1.2)
Total Protein: 7.4 g/dL (ref 6.5–8.1)

## 2022-02-18 LAB — MAGNESIUM: Magnesium: 2.1 mg/dL (ref 1.7–2.4)

## 2022-02-18 LAB — URINALYSIS, COMPLETE (UACMP) WITH MICROSCOPIC
Bilirubin Urine: NEGATIVE
Glucose, UA: 50 mg/dL — AB
Hgb urine dipstick: NEGATIVE
Ketones, ur: NEGATIVE mg/dL
Leukocytes,Ua: NEGATIVE
Nitrite: NEGATIVE
Protein, ur: 30 mg/dL — AB
Specific Gravity, Urine: 1.01 (ref 1.005–1.030)
Squamous Epithelial / HPF: NONE SEEN (ref 0–5)
pH: 7 (ref 5.0–8.0)

## 2022-02-18 LAB — URINE DRUG SCREEN, QUALITATIVE (ARMC ONLY)
Amphetamines, Ur Screen: NOT DETECTED
Barbiturates, Ur Screen: NOT DETECTED
Benzodiazepine, Ur Scrn: NOT DETECTED
Cannabinoid 50 Ng, Ur ~~LOC~~: NOT DETECTED
Cocaine Metabolite,Ur ~~LOC~~: NOT DETECTED
MDMA (Ecstasy)Ur Screen: NOT DETECTED
Methadone Scn, Ur: NOT DETECTED
Opiate, Ur Screen: NOT DETECTED
Phencyclidine (PCP) Ur S: NOT DETECTED
Tricyclic, Ur Screen: NOT DETECTED

## 2022-02-18 LAB — PROTIME-INR
INR: 1.1 (ref 0.8–1.2)
Prothrombin Time: 14.4 seconds (ref 11.4–15.2)

## 2022-02-18 LAB — LACTIC ACID, PLASMA
Lactic Acid, Venous: 2.3 mmol/L (ref 0.5–1.9)
Lactic Acid, Venous: 2 mmol/L (ref 0.5–1.9)
Lactic Acid, Venous: 2.7 mmol/L (ref 0.5–1.9)

## 2022-02-18 LAB — SARS CORONAVIRUS 2 BY RT PCR: SARS Coronavirus 2 by RT PCR: NEGATIVE

## 2022-02-18 LAB — HEMOGLOBIN A1C
Hgb A1c MFr Bld: 6.2 % — ABNORMAL HIGH (ref 4.8–5.6)
Mean Plasma Glucose: 131.24 mg/dL

## 2022-02-18 LAB — TROPONIN I (HIGH SENSITIVITY)
Troponin I (High Sensitivity): 22 ng/L — ABNORMAL HIGH (ref <18)
Troponin I (High Sensitivity): 65 ng/L — ABNORMAL HIGH (ref <18)
Troponin I (High Sensitivity): 47 ng/L — ABNORMAL HIGH (ref <18)

## 2022-02-18 LAB — APTT: aPTT: 25 seconds (ref 24–36)

## 2022-02-18 MED ORDER — ENOXAPARIN SODIUM 60 MG/0.6ML IJ SOSY
0.5000 mg/kg | PREFILLED_SYRINGE | INTRAMUSCULAR | Status: DC
  Administered 2022-02-18: 62.5 mg via SUBCUTANEOUS
  Filled 2022-02-18: qty 1.2

## 2022-02-18 MED ORDER — HYDRALAZINE HCL 20 MG/ML IJ SOLN
5.0000 mg | INTRAMUSCULAR | Status: DC | PRN
Start: 2022-02-18 — End: 2022-02-19
  Administered 2022-02-19: 5 mg via INTRAVENOUS
  Filled 2022-02-18: qty 1

## 2022-02-18 MED ORDER — SODIUM CHLORIDE 0.9 % IV SOLN: INTRAVENOUS | Status: DC

## 2022-02-18 MED ORDER — ATORVASTATIN CALCIUM 20 MG PO TABS
40.0000 mg | ORAL_TABLET | Freq: Every evening | ORAL | Status: DC
  Administered 2022-02-18: 40 mg via ORAL
  Filled 2022-02-18: qty 2

## 2022-02-18 MED ORDER — LISINOPRIL 10 MG PO TABS
10.0000 mg | ORAL_TABLET | Freq: Every day | ORAL | Status: DC
  Administered 2022-02-18 – 2022-02-19 (×2): 10 mg via ORAL
  Filled 2022-02-18 (×2): qty 1

## 2022-02-18 MED ORDER — SODIUM CHLORIDE 0.9 % IV BOLUS
1000.0000 mL | Freq: Once | INTRAVENOUS | Status: AC
  Administered 2022-02-18: 1000 mL via INTRAVENOUS

## 2022-02-18 MED ORDER — ASPIRIN 325 MG PO TBEC
325.0000 mg | DELAYED_RELEASE_TABLET | Freq: Every day | ORAL | Status: DC
  Administered 2022-02-18 – 2022-02-19 (×2): 325 mg via ORAL
  Filled 2022-02-18 (×2): qty 1

## 2022-02-18 MED ORDER — POTASSIUM CHLORIDE CRYS ER 20 MEQ PO TBCR
40.0000 meq | EXTENDED_RELEASE_TABLET | Freq: Once | ORAL | Status: AC
  Administered 2022-02-18: 40 meq via ORAL
  Filled 2022-02-18: qty 2

## 2022-02-18 MED ORDER — IOHEXOL 350 MG/ML SOLN
75.0000 mL | Freq: Once | INTRAVENOUS | Status: AC | PRN
  Administered 2022-02-18: 75 mL via INTRAVENOUS

## 2022-02-18 MED ORDER — LORATADINE 10 MG PO TABS: 10.0000 mg | ORAL_TABLET | Freq: Every day | ORAL | Status: DC | PRN

## 2022-02-18 MED ORDER — SODIUM CHLORIDE 0.9 % IV BOLUS
500.0000 mL | Freq: Once | INTRAVENOUS | Status: AC
  Administered 2022-02-18: 500 mL via INTRAVENOUS

## 2022-02-18 MED ORDER — ACETAMINOPHEN 325 MG PO TABS: 650.0000 mg | ORAL_TABLET | Freq: Four times a day (QID) | ORAL | Status: DC | PRN

## 2022-02-18 MED ORDER — DIPHENHYDRAMINE HCL 50 MG/ML IJ SOLN: 12.5000 mg | Freq: Three times a day (TID) | INTRAMUSCULAR | Status: DC | PRN

## 2022-02-18 MED ORDER — SODIUM CHLORIDE 0.9 % IV SOLN: Freq: Once | INTRAVENOUS | Status: DC

## 2022-02-18 NOTE — Consult Note (Signed)
Cardiology Consultation:   Patient ID: Maxwell Davis MRN: [id number]; DOB: August 08, 1966  Admit date: 02/18/2022 Date of Consult: 02/18/2022  PCP:  Maxwell Farber, MD   [facility] Providers Cardiologist:  Maxwell Nordmann, MD        Patient Profile:   Maxwell Davis is a 56 y.o. male with a hx of hypertension, morbid obesity, OSA, syncope-vasovagal, and NSVT who is being seen 02/18/2022 for the evaluation of near syncope at the request of Dr Clyde Lundborg.  History of Present Illness:   Mr. Maxwell Davis with the above past medical history including HTN, HL, OSA, obesity, seizure disorder, NSVT, and vasovagal-syncope, who presented to the emergency department  after having a near syncopal episode after having abdominal cramping/pain and diarrhea.Patient states that he got up out of the bed in the middle of the night and the room felt hot and he was then feeling lightheaded. He felt like he was going to pass out but never did loss consciousness.  EMS was called and he was initially found to be hypotensive with blood pressure of 90/60. Denies any chest pain or shortness of breath, fevers or chills, and any sick contacts.  Initial vital signs: blood pressure 151/112, pulse 84, resp rate 18, temp 97.2  Pertinent labs: lactic acid 2.3, potassium 3.4, glucose 214, alt 54, wbc 11.3, Hs troponins 22,65,47, respiratory panel negative, UDS negative  Imaging: CXR no acute cardiopulmonary disease, CTA chest negative for PE, shows pulmonary nodule RUL  Medications given: ASA 325 mg, NS infusion, K-dur 40 mEq   Past Medical History:  Diagnosis Date   Essential hypertension    Hyperlipidemia    Morbid obesity (HCC)    OSA (obstructive sleep apnea)    Seizures (HCC)    a. On dilantin.  Last seizure 1995.    Past Surgical History:  Procedure Laterality Date   COLONOSCOPY WITH PROPOFOL N/A 06/10/2015   Procedure: COLONOSCOPY WITH PROPOFOL;  Surgeon: Christena Deem, MD;  Location: [facility] ENDOSCOPY;  Service:  Endoscopy;  Laterality: N/A;   right leg surgery     for tibial fracture     Home Medications:  Prior to Admission medications   Medication Sig Start Date End Date Taking? Authorizing Provider  lisinopril (PRINIVIL,ZESTRIL) 10 MG tablet Take 1 tablet (10 mg total) by mouth daily. 10/25/17   Antonieta Iba, MD  loratadine (CLARITIN) 10 MG tablet Take 10 mg by mouth daily as needed for allergies.    [provider]  phenytoin (DILANTIN) 100 MG ER capsule Take 300 mg by mouth at bedtime.   02/28/20  [provider]    Inpatient Medications: Scheduled Meds:  aspirin EC  325 mg Oral Daily   atorvastatin  40 mg Oral QPM   enoxaparin (LOVENOX) injection  0.5 mg/kg Subcutaneous Q24H   lisinopril  10 mg Oral Daily   Continuous Infusions:  sodium chloride 125 mL/hr at 02/18/22 0946   PRN Meds: acetaminophen, diphenhydrAMINE, hydrALAZINE, loratadine  Allergies:    Allergies  Allergen Reactions   Levetiracetam Other (See Comments)    Irritable, mood swings   Penicillins Rash    Has patient had a PCN reaction causing immediate rash, facial/tongue/throat swelling, SOB or lightheadedness with hypotension: Unknown Has patient had a PCN reaction causing severe rash involving mucus membranes or skin necrosis: Unknown Has patient had a PCN reaction that required hospitalization: No Has patient had a PCN reaction occurring within the last 10 years: No If all of the above answers are "NO", then  may proceed with Cephalosporin use.     Social History:   Social History   Socioeconomic History   Marital status: Married    Spouse name: Not on file   Number of children: Not on file   Years of education: Not on file   Highest education level: Not on file  Occupational History   Not on file  Tobacco Use   Smoking status: Never   Smokeless tobacco: Never  Vaping Use   Vaping Use: Never used  Substance and Sexual Activity   Alcohol use: No   Drug use: No   Sexual  activity: Not on file  Other Topics Concern   Not on file  Social History Narrative   Lives in [place] with his wife and dog.  He does not routinely exercise.   Social Determinants of Health   Financial Resource Strain: Not on file  Food Insecurity: Not on file  Transportation Needs: Not on file  Physical Activity: Not on file  Stress: Not on file  Social Connections: Not on file  Intimate Partner Violence: Not on file    Family History:    Family History  Problem Relation Age of Onset   Diabetes Mother        in her 62's   Hypertension Mother    Prostate cancer Father        in his 7's   CAD Maternal Grandmother    CAD Maternal Grandfather    Other Sister        alive and well   Heart attack Brother        died in his 22's     ROS:  Please see the history of present illness.  Review of Systems  Constitutional:  Positive for malaise/fatigue.  HENT: Negative.    Eyes: Negative.   Respiratory: Negative.    Cardiovascular: Negative.   Gastrointestinal:  Positive for abdominal pain, diarrhea, nausea and vomiting.  Genitourinary: Negative.   Musculoskeletal: Negative.   Skin: Negative.   Neurological:  Positive for weakness.  Endo/Heme/Allergies: Negative.   Psychiatric/Behavioral: Negative.      All other ROS reviewed and negative.     Physical Exam/Data:   Vitals:   02/18/22 0400 02/18/22 0500 02/18/22 1130 02/18/22 1233  BP: (!) 155/84 (!) 149/75 (!) 148/70 (!) 140/105  Pulse: 83 80 81 80  Resp: 12 15 19  (!) 22  Temp:    98.3 F (36.8 C)  TempSrc:    Oral  SpO2: 100% 95% 97% 96%  Weight:      Height:        Intake/Output Summary (Last 24 hours) at 02/18/2022 1313 Last data filed at 02/18/2022 0556 Gross per 24 hour  Intake 1000 ml  Output --  Net 1000 ml      02/18/2022    2:57 AM 09/24/2021   11:55 AM 06/25/2021   10:20 AM  Last 3 Weights  Weight (lbs) 274 lb 4.8 oz 273 lb 3.2 oz 268 lb 3.2 oz  Weight (kg) 124.422 kg 123.923 kg 121.655 kg      Body mass index is 39.36 kg/m.  General:  Well nourished, well developed, in no acute distress HEENT: normal Neck: no JVD Vascular: No carotid bruits; Distal pulses 2+ bilaterally Cardiac:  normal S1, S2; RRR; no murmur  Lungs:  clear to auscultation bilaterally, no wheezing, rhonchi or rales  Abd: soft, nontender, no hepatomegaly  Ext: no edema Musculoskeletal:  No deformities, BUE and BLE strength normal and equal  Skin: warm and dry  Neuro:  CNs 2-12 intact, no focal abnormalities noted Psych:  Normal affect   EKG:  The EKG was personally reviewed and demonstrates:  sinus rate of 93 RSR' V1 and V2 Telemetry:  Telemetry was personally reviewed and demonstrates:  sinus rate 60-70's  Relevant CV Studies: Lexiscan completed in 08/27/2017 Exercise myocardial perfusion imaging study with no significant  ischemia Normal wall motion, EF estimated at 52% No EKG changes concerning for ischemia at peak stress or in recovery. Normal blood pressure response to exercise Low risk scan Echocardiogram completed in 08/26/2017 Left ventricle: The cavity size was normal. Systolic function was    normal. The estimated ejection fraction was in the range of 60%    to 65%. Wall motion was normal; there were no regional wall    motion abnormalities. Left ventricular diastolic function    parameters were normal.  - Left atrium: The atrium was normal in size.  - Right ventricle: Systolic function was normal.  - Pulmonary arteries: Systolic pressure was within the normal    range.   Laboratory Data:  High Sensitivity Troponin:   Recent Labs  Lab 02/18/22 0303 02/18/22 0557 02/18/22 0854  TROPONINIHS 22* 65* 47*     Chemistry Recent Labs  Lab 02/18/22 0303  NA 141  K 3.4*  CL 102  CO2 26  GLUCOSE 214*  BUN 18  CREATININE 1.19  CALCIUM 9.5  MG 2.1  GFRNONAA >60  ANIONGAP 13    Recent Labs  Lab 02/18/22 0303  PROT 7.4  ALBUMIN 4.3  AST 36  ALT 54*  ALKPHOS 60  BILITOT 0.8    Lipids No results for input(s): "CHOL", "TRIG", "HDL", "LABVLDL", "LDLCALC", "CHOLHDL" in the last 168 hours.  Hematology Recent Labs  Lab 02/18/22 0303  WBC 11.3*  RBC 4.99  HGB 14.6  HCT 43.8  MCV 87.8  MCH 29.3  MCHC 33.3  RDW 12.0  PLT 278   Thyroid No results for input(s): "TSH", "FREET4" in the last 168 hours.  BNPNo results for input(s): "BNP", "PROBNP" in the last 168 hours.  DDimer No results for input(s): "DDIMER" in the last 168 hours.   Radiology/Studies:  CT Angio Chest Pulmonary Embolism (PE) W or WO Contrast  Result Date: 02/18/2022 CLINICAL DATA:  Syncope EXAM: CT ANGIOGRAPHY CHEST WITH CONTRAST TECHNIQUE: Multidetector CT imaging of the chest was performed using the standard protocol during bolus administration of intravenous contrast. Multiplanar CT image reconstructions and MIPs were obtained to evaluate the vascular anatomy. RADIATION DOSE REDUCTION: This exam was performed according to the departmental dose-optimization program which includes automated exposure control, adjustment of the mA and/or kV according to patient size and/or use of iterative reconstruction technique. CONTRAST:  53mL OMNIPAQUE IOHEXOL 350 MG/ML SOLN COMPARISON:  None Available. FINDINGS: Cardiovascular: No evidence of pulmonary embolus. Normal heart size. No pericardial effusion. No coronary artery calcifications. Normal caliber thoracic aorta with no evidence of atherosclerotic disease. Mediastinum/Nodes: Esophagus and thyroid are unremarkable. No pathologically enlarged nodes seen in the chest. Lungs/Pleura: Central airways are patent. Expiratory phase imaging with bilateral atelectasis. No consolidation, pleural effusion or pneumothorax. Small solid pulmonary nodule of the right upper lobe measuring 3 mm on series 5, image 32. Upper Abdomen: No acute abnormality. Musculoskeletal: No chest wall abnormality. No acute or significant osseous findings. Review of the MIP images confirms the above  findings. IMPRESSION: 1. No evidence of pulmonary embolus or acute airspace opacity. 2. Small solid pulmonary nodule of the right upper  lobe measuring 3 mm. No follow-up needed if patient is low-risk.This recommendation follows the consensus statement: Guidelines for Management of Incidental Pulmonary Nodules Detected on CT Images: From the Fleischner Society 2017; Radiology 2017; 284:228-243. Electronically Signed   By: Yetta Glassman M.D.   On: 02/18/2022 10:17   DG Chest Port 1 View  Result Date: 02/18/2022 CLINICAL DATA:  Possible sepsis EXAM: PORTABLE CHEST 1 VIEW COMPARISON:  08/26/2017 FINDINGS: Lungs are clear.  No pleural effusion or pneumothorax. The heart is normal in size. IMPRESSION: No evidence of acute cardiopulmonary disease. Electronically Signed   By: Julian Hy M.D.   On: 02/18/2022 03:28     Assessment and Plan:   Near syncope       H/o of NSVT -patient with history of vasovagal syncope -Orthostatic vitals -CTA chest negative for PE -gentle hydration -orthostatic vital signs  -follow up outpatient with Dr Caryl Comes  Elevated Hs troponins -likely secondary to demand ischemia from elevated blood pressure and hypotensive episode with EMS -patient currently pain free -Hs troponins trended flat 22,65,47 -EKG without ischemic changes -continue cardiac monitor -EKG PRN  Essential hypertension -blood pressure 140/105 -continue lisinopril 10 mg daily -vital signs per unit protocol -continue PRN hydralazine 5 mg IVP  Hyperlipidemia -LDL 85 on 05/27/2021 -lipid panel pending -continue atorvastatin 40 mg daily  Hypokalemia -potassium 3.4 -given K-Dur 40 mEq  -daily bmp  H/o seizure disorder -previously followed with [facility] -stated he was taken off of dilantin by neuro -seizure precautions -managed by primary team   Risk Assessment/Risk Scores:                For questions or updates, please contact [facility] Please consult  [email or web address] for contact info under    Signed, Jezebelle Ledwell, NP  02/18/2022 1:13 PM

## 2022-02-18 NOTE — ED Notes (Signed)
Patient in CT

## 2022-02-18 NOTE — Assessment & Plan Note (Addendum)
Recent diarrhea seemed to help.. -If he has diarrhea again, will check C diff and GI pathogen panel -IV fluid: As above

## 2022-02-18 NOTE — Assessment & Plan Note (Signed)
Body temperature 97.2.  No source of infection identified. -Keep warm for pt

## 2022-02-18 NOTE — Assessment & Plan Note (Addendum)
HR is 84 -pt is following up with Dr. Graciela Husbands of card. -tele monitoring

## 2022-02-18 NOTE — Assessment & Plan Note (Signed)
-   IV hydralazine as needed -Lisinopril 

## 2022-02-18 NOTE — Assessment & Plan Note (Signed)
WBC 11.3.  No source infection identified. -Follow-up blood culture and urine culture

## 2022-02-18 NOTE — Assessment & Plan Note (Signed)
CT angiogram negative for PE, but showed 3 mm pulmonary nodule on the right upper lobe.  Patient is a non-smoker.  Low risk. -Follow-up with PCP

## 2022-02-18 NOTE — Assessment & Plan Note (Signed)
Lactic acid 2.3, 2.0, 2.7.  Patient has mild leukocytosis with WBC 11.3, but does not meet criteria for sepsis.  No source of infection identified. -Trend lactic acid -Continue IV fluid as above -Follow-up blood culture and urine culture

## 2022-02-18 NOTE — Assessment & Plan Note (Addendum)
  Syncope: Etiology is not clear.  Patient has history of remote seizure 1995.  He was on Dilantin which was discontinued by neurologist.  Patient denies any seizure activity recently or today. The differential diagnosis is broad, including vasovagal syncope, ACS, drug abuse, orthostatic status and pulmonary embolism.  Patient has mildly elevated troponin 22, 65, 47, but denies any chest pain, less likely to have ACS.  Patient had hypotension initially and mildly elevated troponin, will need to rule out PE.   - Place on progressive unit for obs - Orthostatic vital signs  - Neuro checks  - IVF: 1.5 L of NS, then NS 75 cc/h - f/u CTA to r/o PE --> negatives for PE -Consulted cardiology, Dr. Eliezer Lofts

## 2022-02-18 NOTE — Assessment & Plan Note (Signed)
-  CPAP is ordered

## 2022-02-18 NOTE — Assessment & Plan Note (Signed)
Patient is not taking medications currently -Start Lipitor 40 mg daily due to elevated troponin

## 2022-02-18 NOTE — Assessment & Plan Note (Signed)
BMI= 39.36  and BW= 124.4 kg -Diet and exercise.   -Encouraged

## 2022-02-18 NOTE — H&P (Addendum)
History and Physical    Maxwell Davis WJX:[id number] DOB: 05-Jul-1966 DOA: 02/18/2022  Referring MD/NP/PA:   PCP: Mickey Farberhies, [place], MD   Patient coming from:  The patient is coming from home.  At baseline, pt is independent for most of ADL.        Chief Complaint: near syncope  HPI: Maxwell Davis is a 56 y.o. male with medical history significant of hypertension, hyperlipidemia, remote seizure not on medications, OSA on CPAP, obesity with BMI 39.36, vasovagal syncope, NSVT, who presents with near syncope.  Patient states that got up out of bed for going to the bathroom in the middle night. He was feeling like that the room was hot and, then feeling lightheaded.  He felt like he was going to pass out, but did not lose consciousness.  Denies unilateral numbness or tingling in extremities, no facial droop or slurred speech.  He states that he had some diarrhea with some crampy abdominal pain, which has resolved currently.  Denies nausea, vomiting. Patient does not have any chest pain, cough or shortness of breath.  No symptoms of UTI. EMS was called,  and pt reportedly was hypotensive initially, with BP 90/60.  Data reviewed independently and ED Course: pt was found to have WBC 11.3, lactic acid 2.3, 2.0, 2.7, troponin level 22, 65, 47, negative urinalysis, GFR> 60, potassium 3.4, temperature 97.2, blood pressure 155/84, heart rate 84, RR 18, oxygen saturation 98% on room air.  Chest x-ray negative.  Patient is placed on telemetry bed for observation. Consulted Dr. Azucena CecilAgbor-Etang of card.   EKG: I have personally reviewed.  Sinus rhythm, QTc 512, LAD, early R wave progression, nonspecific T wave change  Review of Systems:   General: no fevers, chills, no body weight gain, has fatigue HEENT: no blurry vision, hearing changes or sore throat Respiratory: no dyspnea, coughing, wheezing CV: no chest pain, no palpitations GI: no nausea, vomiting, has abdominal pain, diarrhea, no constipation GU: no  dysuria, burning on urination, increased urinary frequency, hematuria  Ext: no leg edema Neuro: no unilateral weakness, numbness, or tingling, no vision change or hearing loss. Has near syncope Skin: no rash, no skin tear. MSK: No muscle spasm, no deformity, no limitation of range of movement in spin Heme: No easy bruising.  Travel history: No recent long distant travel.   Allergy:  Allergies  Allergen Reactions   Levetiracetam Other (See Comments)    Irritable, mood swings   Penicillins Rash    Has patient had a PCN reaction causing immediate rash, facial/tongue/throat swelling, SOB or lightheadedness with hypotension: Unknown Has patient had a PCN reaction causing severe rash involving mucus membranes or skin necrosis: Unknown Has patient had a PCN reaction that required hospitalization: No Has patient had a PCN reaction occurring within the last 10 years: No If all of the above answers are "NO", then may proceed with Cephalosporin use.     Past Medical History:  Diagnosis Date   Essential hypertension    Hyperlipidemia    Morbid obesity (HCC)    OSA (obstructive sleep apnea)    Seizures (HCC)    a. On dilantin.  Last seizure 1995.    Past Surgical History:  Procedure Laterality Date   COLONOSCOPY WITH PROPOFOL N/A 06/10/2015   Procedure: COLONOSCOPY WITH PROPOFOL;  Surgeon: Christena DeemMartin U Skulskie, MD;  [facility];  Service: Endoscopy;  Laterality: N/A;   right leg surgery     for tibial fracture    Social History:  reports  that he has never smoked. He has never used smokeless tobacco. He reports that he does not drink alcohol and does not use drugs.  Family History:  Family History  Problem Relation Age of Onset   Diabetes Mother        in her 83's   Hypertension Mother    Prostate cancer Father        in his 79's   CAD Maternal Grandmother    CAD Maternal Grandfather    Other Sister        alive and well   Heart attack Brother        died in his  40's     Prior to Admission medications   Medication Sig Start Date End Date Taking? Authorizing Provider  lisinopril (PRINIVIL,ZESTRIL) 10 MG tablet Take 1 tablet (10 mg total) by mouth daily. 10/25/17   Antonieta Iba, MD  loratadine (CLARITIN) 10 MG tablet Take by mouth.    [provider]  phenytoin (DILANTIN) 100 MG ER capsule Take 300 mg by mouth at bedtime.   02/28/20  [provider]    Physical Exam: Vitals:   02/18/22 0400 02/18/22 0500 02/18/22 1130 02/18/22 1233  BP: (!) 155/84 (!) 149/75 (!) 148/70 (!) 140/105  Pulse: 83 80 81 80  Resp: 12 15 19  (!) 22  Temp:    98.3 F (36.8 C)  TempSrc:    Oral  SpO2: 100% 95% 97% 96%  Weight:      Height:       General: Not in acute distress HEENT:       Eyes: PERRL, EOMI, no scleral icterus.       ENT: No discharge from the ears and nose, no pharynx injection, no tonsillar enlargement.        Neck: No JVD, no bruit, no mass felt. Heme: No neck lymph node enlargement. Cardiac: S1/S2, RRR, No murmurs, No gallops or rubs. Respiratory: No rales, wheezing, rhonchi or rubs. GI: Soft, nondistended, nontender, no rebound pain, no organomegaly, BS present. GU: No hematuria Ext: No pitting leg edema bilaterally. 1+DP/PT pulse bilaterally. Musculoskeletal: No joint deformities, No joint redness or warmth, no limitation of ROM in spin. Skin: No rashes.  Neuro: Alert, oriented X3, cranial nerves II-XII grossly intact, moves all extremities normally.  Psych: Patient is not psychotic, no suicidal or hemocidal ideation.  Labs on Admission: I have personally reviewed following labs and imaging studies  CBC: Recent Labs  Lab 02/18/22 0303  WBC 11.3*  NEUTROABS 7.2  HGB 14.6  HCT 43.8  MCV 87.8  PLT 278   Basic Metabolic Panel: Recent Labs  Lab 02/18/22 0303  NA 141  K 3.4*  CL 102  CO2 26  GLUCOSE 214*  BUN 18  CREATININE 1.19  CALCIUM 9.5  MG 2.1   GFR: Estimated Creatinine Clearance: 91.8 mL/min  (by C-G formula based on SCr of 1.19 mg/dL). Liver Function Tests: Recent Labs  Lab 02/18/22 0303  AST 36  ALT 54*  ALKPHOS 60  BILITOT 0.8  PROT 7.4  ALBUMIN 4.3   No results for input(s): "LIPASE", "AMYLASE" in the last 168 hours. No results for input(s): "AMMONIA" in the last 168 hours. Coagulation Profile: Recent Labs  Lab 02/18/22 0303  INR 1.1   Cardiac Enzymes: No results for input(s): "CKTOTAL", "CKMB", "CKMBINDEX", "TROPONINI" in the last 168 hours. BNP (last 3 results) No results for input(s): "PROBNP" in the last 8760 hours. HbA1C: Recent Labs    02/18/22 0303  HGBA1C 6.2*   CBG: No results for input(s): "GLUCAP" in the last 168 hours. Lipid Profile: No results for input(s): "CHOL", "HDL", "LDLCALC", "TRIG", "CHOLHDL", "LDLDIRECT" in the last 72 hours. Thyroid Function Tests: No results for input(s): "TSH", "T4TOTAL", "FREET4", "T3FREE", "THYROIDAB" in the last 72 hours. Anemia Panel: No results for input(s): "VITAMINB12", "FOLATE", "FERRITIN", "TIBC", "IRON", "RETICCTPCT" in the last 72 hours. Urine analysis:    Component Value Date/Time   COLORURINE YELLOW (A) 02/18/2022 0303   APPEARANCEUR CLEAR (A) 02/18/2022 0303   LABSPEC 1.010 02/18/2022 0303   PHURINE 7.0 02/18/2022 0303   GLUCOSEU 50 (A) 02/18/2022 0303   HGBUR NEGATIVE 02/18/2022 0303   BILIRUBINUR NEGATIVE 02/18/2022 0303   KETONESUR NEGATIVE 02/18/2022 0303   PROTEINUR 30 (A) 02/18/2022 0303   NITRITE NEGATIVE 02/18/2022 0303   LEUKOCYTESUR NEGATIVE 02/18/2022 0303   Sepsis Labs: @LABRCNTIP (procalcitonin:4,lacticidven:4) ) Recent Results (from the past 240 hour(s))  Blood Culture (routine x 2)     Status: None (Preliminary result)   Collection Time: 02/18/22  3:03 AM   Specimen: BLOOD  Result Value Ref Range Status   Specimen Description BLOOD LEFT AC  Final   Special Requests   Final    BOTTLES DRAWN AEROBIC AND ANAEROBIC Blood Culture results may not be optimal due to an  excessive volume of blood received in culture bottles   Culture   Final    NO GROWTH < 12 HOURS Performed at [facility], [address] [place], [place]    Report Status PENDING  Incomplete  Blood Culture (routine x 2)     Status: None (Preliminary result)   Collection Time: 02/18/22  3:03 AM   Specimen: BLOOD  Result Value Ref Range Status   Specimen Description BLOOD RIGHT [facility]  Final   Special Requests   Final    BOTTLES DRAWN AEROBIC AND ANAEROBIC Blood Culture adequate volume   Culture   Final    NO GROWTH < 12 HOURS Performed at [facility], [address] [place], [place]    Report Status PENDING  Incomplete  SARS Coronavirus 2 by RT PCR (hospital order, performed in [facility] hospital lab) *cepheid single result test* Anterior Nasal Swab     Status: None   Collection Time: 02/18/22  8:49 AM   Specimen: Anterior Nasal Swab  Result Value Ref Range Status   SARS Coronavirus 2 by RT PCR NEGATIVE NEGATIVE Final    Comment: (NOTE) SARS-CoV-2 target nucleic acids are NOT DETECTED.  The SARS-CoV-2 RNA is generally detectable in upper and lower respiratory specimens during the acute phase of infection. The lowest concentration of SARS-CoV-2 viral copies this assay can detect is 250 copies / mL. A negative result does not preclude SARS-CoV-2 infection and should not be used as the sole basis for treatment or other patient management decisions.  A negative result may occur with improper specimen collection / handling, submission of specimen other than nasopharyngeal swab, presence of viral mutation(s) within the areas targeted by this assay, and inadequate number of viral copies (<250 copies / mL). A negative result must be combined with clinical observations, patient history, and epidemiological information.  Fact Sheet for Patients:   04/21/22  Fact Sheet for Healthcare  Providers: RoadLapTop.co.za  This test is not yet approved or  cleared by the http://kim-miller.com/ FDA and has been authorized for detection and/or diagnosis of SARS-CoV-2 by FDA under an Emergency Use Authorization (EUA).  This EUA will remain in effect (  meaning this test can be used) for the duration of the COVID-19 declaration under Section 564(b)(1) of the Act, 21 U.S.C. section 360bbb-3(b)(1), unless the authorization is terminated or revoked sooner.  Performed at [facility], [address] [place], [place] [address]      Radiological Exams on Admission: CT Angio Chest Pulmonary Embolism (PE) W or WO Contrast  Result Date: 02/18/2022 CLINICAL DATA:  Syncope EXAM: CT ANGIOGRAPHY CHEST WITH CONTRAST TECHNIQUE: Multidetector CT imaging of the chest was performed using the standard protocol during bolus administration of intravenous contrast. Multiplanar CT image reconstructions and MIPs were obtained to evaluate the vascular anatomy. RADIATION DOSE REDUCTION: This exam was performed according to the departmental dose-optimization program which includes automated exposure control, adjustment of the mA and/or kV according to patient size and/or use of iterative reconstruction technique. CONTRAST:  63mL OMNIPAQUE IOHEXOL 350 MG/ML SOLN COMPARISON:  None Available. FINDINGS: Cardiovascular: No evidence of pulmonary embolus. Normal heart size. No pericardial effusion. No coronary artery calcifications. Normal caliber thoracic aorta with no evidence of atherosclerotic disease. Mediastinum/Nodes: Esophagus and thyroid are unremarkable. No pathologically enlarged nodes seen in the chest. Lungs/Pleura: Central airways are patent. Expiratory phase imaging with bilateral atelectasis. No consolidation, pleural effusion or pneumothorax. Small solid pulmonary nodule of the right upper lobe measuring 3 mm on series 5, image 32. Upper Abdomen: No acute abnormality.  Musculoskeletal: No chest wall abnormality. No acute or significant osseous findings. Review of the MIP images confirms the above findings. IMPRESSION: 1. No evidence of pulmonary embolus or acute airspace opacity. 2. Small solid pulmonary nodule of the right upper lobe measuring 3 mm. No follow-up needed if patient is low-risk.This recommendation follows the consensus statement: Guidelines for Management of Incidental Pulmonary Nodules Detected on CT Images: From the Fleischner Society 2017; Radiology 2017; 284:228-243. Electronically Signed   By: Allegra Lai M.D.   On: 02/18/2022 10:17   DG Chest Port 1 View  Result Date: 02/18/2022 CLINICAL DATA:  Possible sepsis EXAM: PORTABLE CHEST 1 VIEW COMPARISON:  08/26/2017 FINDINGS: Lungs are clear.  No pleural effusion or pneumothorax. The heart is normal in size. IMPRESSION: No evidence of acute cardiopulmonary disease. Electronically Signed   By: Charline Bills M.D.   On: 02/18/2022 03:28      Assessment/Plan Principal Problem:   Near syncope Active Problems:   NSVT (nonsustained ventricular tachycardia) (HCC)   Myocardial injury   Diarrhea   Elevated lactic acid level   Essential hypertension   Hyperlipidemia   Hypothermia   Leukocytosis   Obstructive sleep apnea on CPAP   Lung nodule   Obesity with body mass index of 30.0-39.9   Assessment and Plan: * Near syncope  Syncope: Etiology is not clear.  Patient has history of remote seizure 1995.  He was on Dilantin which was discontinued by neurologist.  Patient denies any seizure activity recently or today. The differential diagnosis is broad, including vasovagal syncope, ACS, drug abuse, orthostatic status and pulmonary embolism.  Patient has mildly elevated troponin 22, 65, 47, but denies any chest pain, less likely to have ACS.  Patient had hypotension initially and mildly elevated troponin, will need to rule out PE.   - Place on progressive unit for obs - Orthostatic vital  signs  - Neuro checks  - IVF: 1.5 L of NS, then NS 75 cc/h - f/u CTA to r/o PE --> negatives for PE -Consulted cardiology, Dr. Eliezer Lofts   NSVT (nonsustained ventricular tachycardia) (HCC) HR is 84 -pt is following  up with Dr. Graciela Husbands of card. -tele monitoring  Myocardial injury Myocardial injury due to near syncope. Troponin level 22, 65, 47.  Denies chest pain. -Aspirin 325 mg daily -Trend troponin - check UDS, A1c, FLP   Diarrhea Recent diarrhea seemed to help.. -If he has diarrhea again, will check C diff and GI pathogen panel -IV fluid: As above  Elevated lactic acid level Lactic acid 2.3, 2.0, 2.7.  Patient has mild leukocytosis with WBC 11.3, but does not meet criteria for sepsis.  No source of infection identified. -Trend lactic acid -Continue IV fluid as above -Follow-up blood culture and urine culture  Essential hypertension - IV hydralazine as needed - Lisinopril  Hyperlipidemia Patient is not taking medications currently -Start Lipitor 40 mg daily due to elevated troponin  Hypothermia Body temperature 97.2.  No source of infection identified. -Keep warm for pt  Leukocytosis WBC 11.3.  No source infection identified. -Follow-up blood culture and urine culture  Obstructive sleep apnea on CPAP -CPAP is ordered  Lung nodule CT angiogram negative for PE, but showed 3 mm pulmonary nodule on the right upper lobe.  Patient is a non-smoker.  Low risk. -Follow-up with PCP  Obesity with body mass index of 30.0-39.9 BMI= 39.36  and BW= 124.4 kg -Diet and exercise.   -Encouraged          DVT ppx:  SQ Lovenox  Code Status: Full code  Family Communication: not done, no family member is at bed side.        Disposition Plan:  Anticipate discharge back to previous environment  Consults called:  Consulted Dr. Azucena Cecil of card  Admission status and Level of care: Telemetry Medical:    for obs      Severity of Illness:  The appropriate  patient status for this patient is OBSERVATION. Observation status is judged to be reasonable and necessary in order to provide the required intensity of service to ensure the patient's safety. The patient's presenting symptoms, physical exam findings, and initial radiographic and laboratory data in the context of their medical condition is felt to place them at decreased risk for further clinical deterioration. Furthermore, it is anticipated that the patient will be medically stable for discharge from the hospital within 2 midnights of admission.        Date of Service 02/18/2022    Lorretta Harp [facility]   If 7PM-7AM, please contact night-coverage [email or web address] 02/18/2022, 12:53 PM

## 2022-02-18 NOTE — ED Provider Notes (Signed)
[facility] Provider Note    Event Date/Time   First MD Initiated Contact with Patient 02/18/22 [phone]     (approximate)   History   Code Sepsis (Pt from home with end title 24 and bp 90/60.)   HPI  Maxwell Davis is a 56 y.o. male patient of syncopal episode that occurred after he had episode of diarrhea.  Felt like he was at the urgent care to the bathroom this evening.  Got up out of bed was feeling like the room was hot was feeling lightheaded.  Did have episode of watery diarrhea with some crampy abdominal pain.  Brief fainting spell no LOC did not hit his head.  No neck pain.  Denies any pain or discomfort at this time.  EMS was called reportedly was hypotensive initially as a code sepsis was called.  He denies any fevers or chills.  No recent antibiotics.  No melena no hematochezia.  Denies any chest pain or pressure.  Says that this has happened to him before and was told by his PCP that he may have vasovagal syncope.     Physical Exam   Triage Vital Signs: ED Triage Vitals  Enc Vitals Group     BP 02/18/22 0256 (!) 151/112     Pulse Rate 02/18/22 0256 84     Resp 02/18/22 0256 18     Temp 02/18/22 0256 (!) 97.2 F (36.2 C)     Temp Source 02/18/22 0256 Rectal     SpO2 02/18/22 0256 98 %     Weight 02/18/22 0257 274 lb 4.8 oz (124.4 kg)     Height 02/18/22 0257 5\' 10"  (1.778 m)     Head Circumference --      Peak Flow --      Pain Score 02/18/22 0256 0     Pain Loc --      Pain Edu? --      Excl. in [place]? --     Most recent vital signs: Vitals:   02/18/22 0400 02/18/22 0500  BP: (!) 155/84 (!) 149/75  Pulse: 83 80  Resp: 12 15  Temp:    SpO2: 100% 95%     Constitutional: Alert  Eyes: Conjunctivae are normal.  Head: Atraumatic. Nose: No congestion/rhinnorhea. Mouth/Throat: Mucous membranes are moist.   Neck: Painless ROM.  Cardiovascular:   Good peripheral circulation. Respiratory: Normal respiratory effort.  No retractions.   Gastrointestinal: Soft and nontender.  Musculoskeletal:  no deformity Neurologic:  MAE spontaneously. No gross focal neurologic deficits are appreciated.  Skin:  Skin is warm, dry and intact. No rash noted. Psychiatric: Mood and affect are normal. Speech and behavior are normal.    ED Results / Procedures / Treatments   Labs (all labs ordered are listed, but only abnormal results are displayed) Labs Reviewed  LACTIC ACID, PLASMA - Abnormal; Notable for the following components:      Result Value   Lactic Acid, Venous 2.3 (*)    All other components within normal limits  LACTIC ACID, PLASMA - Abnormal; Notable for the following components:   Lactic Acid, Venous 2.0 (*)    All other components within normal limits  COMPREHENSIVE METABOLIC PANEL - Abnormal; Notable for the following components:   Potassium 3.4 (*)    Glucose, Bld 214 (*)    ALT 54 (*)    All other components within normal limits  CBC WITH DIFFERENTIAL/PLATELET - Abnormal; Notable for the following components:   WBC  11.3 (*)    All other components within normal limits  URINALYSIS, COMPLETE (UACMP) WITH MICROSCOPIC - Abnormal; Notable for the following components:   Color, Urine YELLOW (*)    APPearance CLEAR (*)    Glucose, UA 50 (*)    Protein, ur 30 (*)    Bacteria, UA RARE (*)    All other components within normal limits  TROPONIN I (HIGH SENSITIVITY) - Abnormal; Notable for the following components:   Troponin I (High Sensitivity) 22 (*)    All other components within normal limits  TROPONIN I (HIGH SENSITIVITY) - Abnormal; Notable for the following components:   Troponin I (High Sensitivity) 65 (*)    All other components within normal limits  CULTURE, BLOOD (ROUTINE X 2)  CULTURE, BLOOD (ROUTINE X 2)  SARS CORONAVIRUS 2 BY RT PCR  PROTIME-INR  APTT     EKG  ED ECG REPORT I, Willy Eddy, the attending physician, personally viewed and interpreted this ECG.   Date: 02/18/2022  EKG Time:  2:51  Rate: 90  Rhythm: sinus  Axis: normal  Intervals:borderline prolonged qt  ST&T Change: no stemi    RADIOLOGY Please see ED Course for my review and interpretation.  I personally reviewed all radiographic images ordered to evaluate for the above acute complaints and reviewed radiology reports and findings.  These findings were personally discussed with the patient.  Please see medical record for radiology report.    PROCEDURES:  Critical Care performed:   Procedures   MEDICATIONS ORDERED IN ED: Medications  aspirin EC tablet 325 mg (has no administration in time range)  0.9 %  sodium chloride infusion (has no administration in time range)  potassium chloride SA (KLOR-CON M) CR tablet 40 mEq (40 mEq Oral Given 02/18/22 0359)  sodium chloride 0.9 % bolus 1,000 mL (0 mLs Intravenous Stopped 02/18/22 0556)     IMPRESSION / MDM / ASSESSMENT AND PLAN / ED COURSE  I reviewed the triage vital signs and the nursing notes.                              Differential diagnosis includes, but is not limited to, dehydration, sepsis, vasovagal, electrolyte abnormality, dysrhythmia, enteritis, gastritis  Patient presented to the ER for evaluation of symptoms as described above.  This presenting complaint could reflect a potentially life-threatening illness therefore the patient will be placed on continuous pulse oximetry and telemetry for monitoring.  Laboratory evaluation will be sent to evaluate for the above complaints.      Clinical Course as of 02/18/22 0710  Wed Feb 18, 2022  0330 Chest x-ray by my review and interpretation without evidence of of effusion. [PR]  0709 Patient with rising troponin.  Repeat exam is benign he is chest pain-free but given the syncopal episode with elevated troponin do feel he will require admission for telemetry monitoring and further medical management.  We will give additional IV fluids.  Document states that sepsis is not had any fever no  significant white count and no source.  No flank pain.   [PR]    Clinical Course User Index [PR] Willy Eddy, MD    FINAL CLINICAL IMPRESSION(S) / ED DIAGNOSES   Final diagnoses:  Syncope, unspecified syncope type     Rx / DC Orders   ED Discharge Orders     None        Note:  This document was prepared using Dragon voice  recognition software and may include unintentional dictation errors.    Willy Eddy, MD 02/18/22 [phone]

## 2022-02-18 NOTE — ED Triage Notes (Signed)
Pt from home brought by EMS has a sepsis protocol, pt's end tidal was 24 and BP 90/60. According to the pt, his had an episode like this before, he woke up feeling hot. Pt is visibly shivering at times, rectal temp 97.2. Pt is alert and oriented,

## 2022-02-18 NOTE — Assessment & Plan Note (Signed)
Myocardial injury due to near syncope. Troponin level 22, 65, 47.  Denies chest pain. -Aspirin 325 mg daily -Trend troponin - check UDS, A1c, FLP

## 2022-02-19 DIAGNOSIS — I5A Non-ischemic myocardial injury (non-traumatic): Secondary | ICD-10-CM

## 2022-02-19 DIAGNOSIS — I4729 Other ventricular tachycardia: Secondary | ICD-10-CM

## 2022-02-19 DIAGNOSIS — R7989 Other specified abnormal findings of blood chemistry: Secondary | ICD-10-CM | POA: Diagnosis not present

## 2022-02-19 DIAGNOSIS — T68XXXA Hypothermia, initial encounter: Secondary | ICD-10-CM

## 2022-02-19 DIAGNOSIS — R911 Solitary pulmonary nodule: Secondary | ICD-10-CM

## 2022-02-19 DIAGNOSIS — R197 Diarrhea, unspecified: Secondary | ICD-10-CM | POA: Diagnosis not present

## 2022-02-19 DIAGNOSIS — I10 Essential (primary) hypertension: Secondary | ICD-10-CM | POA: Diagnosis not present

## 2022-02-19 DIAGNOSIS — R55 Syncope and collapse: Secondary | ICD-10-CM | POA: Diagnosis not present

## 2022-02-19 LAB — URINE CULTURE

## 2022-02-19 LAB — BASIC METABOLIC PANEL
Anion gap: 7 (ref 5–15)
BUN: 14 mg/dL (ref 6–20)
CO2: 21 mmol/L — ABNORMAL LOW (ref 22–32)
Calcium: 8.3 mg/dL — ABNORMAL LOW (ref 8.9–10.3)
Chloride: 109 mmol/L (ref 98–111)
Creatinine, Ser: 0.88 mg/dL (ref 0.61–1.24)
GFR, Estimated: >60 mL/min (ref >60)
Glucose, Bld: 121 mg/dL — ABNORMAL HIGH (ref 70–99)
Potassium: 3.8 mmol/L (ref 3.5–5.1)
Sodium: 137 mmol/L (ref 135–145)

## 2022-02-19 LAB — CBC
HCT: 41.9 % (ref 39.0–52.0)
Hemoglobin: 13.9 g/dL (ref 13.0–17.0)
MCH: 29.2 pg (ref 26.0–34.0)
MCHC: 33.2 g/dL (ref 30.0–36.0)
MCV: 88 fL (ref 80.0–100.0)
Platelets: 231 10*3/uL (ref 150–400)
RBC: 4.76 MIL/uL (ref 4.22–5.81)
RDW: 12.1 % (ref 11.5–15.5)
WBC: 10.4 10*3/uL (ref 4.0–10.5)
nRBC: 0 % (ref 0.0–0.2)

## 2022-02-19 LAB — LIPID PANEL
Cholesterol: 136 mg/dL (ref 0–200)
HDL: 31 mg/dL — ABNORMAL LOW (ref >40)
LDL Cholesterol: 76 mg/dL (ref 0–99)
Total CHOL/HDL Ratio: 4.4 RATIO
Triglycerides: 144 mg/dL (ref <150)
VLDL: 29 mg/dL (ref 0–40)

## 2022-02-19 LAB — LACTIC ACID, PLASMA: Lactic Acid, Venous: 1.1 mmol/L (ref 0.5–1.9)

## 2022-02-19 LAB — HIV ANTIBODY (ROUTINE TESTING W REFLEX): HIV Screen 4th Generation wRfx: NONREACTIVE

## 2022-02-19 LAB — TROPONIN I (HIGH SENSITIVITY): Troponin I (High Sensitivity): 14 ng/L (ref <18)

## 2022-02-19 LAB — CBG MONITORING, ED: Glucose-Capillary: 168 mg/dL — ABNORMAL HIGH (ref 70–99)

## 2022-02-19 MED ORDER — SODIUM CHLORIDE 0.9 % IV BOLUS
1000.0000 mL | Freq: Once | INTRAVENOUS | Status: AC
  Administered 2022-02-19: 1000 mL via INTRAVENOUS

## 2022-02-19 MED ORDER — LISINOPRIL 10 MG PO TABS: 10.0000 mg | ORAL_TABLET | Freq: Every evening | ORAL | Status: DC

## 2022-02-19 NOTE — ED Notes (Signed)
Pt is alert and oriented x4; up ad lib. Vitals at baseline, MD aware. Discharge instructions discussed with a patient. Pt is going home with wife. All questions/concerns addressed. No further needs at this time.   02/19/22 1248  Vitals  Temp 98.1 F (36.7 C)  Temp Source Oral  BP (!) 171/88  MAP (mmHg) 110  BP Location Left Arm  BP Method Automatic  Patient Position (if appropriate) Sitting  Pulse Rate Source Monitor  ECG Heart Rate 71  Resp 17  Level of Consciousness  Level of Consciousness Alert  MEWS COLOR  MEWS Score Color Green  Oxygen Therapy  SpO2 99 %  O2 Device Room Air

## 2022-02-19 NOTE — Discharge Summary (Signed)
Physician Discharge Summary   Patient: Maxwell Davis MRN: [id number] DOB: 1966-05-03  Admit date:     02/18/2022  Discharge date: 02/19/22  Discharge Physician: Joycelyn Das   PCP: Mickey Farber, MD   Recommendations at discharge:   Follow-up with primary care physician in 1 week.   Patient has a pulmonary nodule of 3 mm upper lobe on the CT scan.  No follow-up is recommended if low risk.  Check  CBC, BMP in the next visit Follow-up with cardiology as outpatient for history of syncope.  Discharge Diagnoses: Active Problems:   Essential hypertension   Hyperlipidemia   Obstructive sleep apnea on CPAP   Lung nodule   Obesity with body mass index of 30.0-39.9  Principal Problem (Resolved):   Syncope Resolved Problems:   NSVT (nonsustained ventricular tachycardia) (HCC)   Myocardial injury   Diarrhea   Elevated lactic acid level   Hypothermia   Leukocytosis  Hospital Course: Maxwell Davis is a 56 y.o. male with past medical history of hypertension, hyperlipidemia, history of seizures not on medication, obstructive sleep apnea on CPAP, obesity, vasovagal syncope, presented to hospital after passing out spell.  He stated that he was feeling hot and lightheaded and had some diarrhea as well.  He had been working in the hot without [facility] involvement prior to this.  EMS was called in and was reportedly hypotensive with a blood pressure of 90/60.  In the ED patient had mild leukocytosis and lactate was elevated at 2.3.  Temperature was slightly low as well.  Chest x-ray was negative.  EKG showed normal sinus rhythm.  Patient was then admitted hospital for syncope work-up.  Cardiology was consulted as well.     Following conditions were addressed during hospitalization,  Syncope: Likely secondary to orthostatic hypotension which improved with IV fluid boluses while in the hospital.  Patient was discussed about orthostatic precautions on discharge.  Encourage oral hydration on discharge.   Patient does have history of vasovagal syncope in the past.  Follows up with cardiology as outpatient.  Cardiology followed during this hospitalization as well and recommended no further work-up and outpatient follow-up. Chest CTA was negative for PE.  NSVT (nonsustained ventricular tachycardia) (HCC) Patient follows up with Dr. Graciela Husbands of card.  No episodes while in the hospital.  Elevated troponins.  Mild.  Nonspecific.  Diarrhea-clear causing volume depletion.  Has improved at this time.  No further episodes.  Elevated lactic acid level-resolved .  Likely secondary to volume depletion.  Blood culture negative in 1 day urine culture with several species.  Essential hypertension Was orthostatic during hospitalization and required IV fluids.  Will resume lisinopril 02/20/2022  Hyperlipidemia Not on medications.  Lipid profile showed LDL of 76.  Leukocytosis-likely reactive.  No source of infection.  Cultures negative.  Has normalized.  Obstructive sleep apnea on CPAP Continue on discharge.  Lung nodule CT angiogram negative for PE, but showed 3 mm pulmonary nodule on the right upper lobe.  Patient is a non-smoker.  Low risk.  No follow-up recommended if low risk   Obesity with body mass index of 30.0-39.9 BMI= 39.36.  Would benefit from ongoing weight loss as outpatient.  Consultants: Cardiology  Procedures performed: None  Disposition: Home  Diet recommendation:  Discharge Diet Orders (From admission, onward)     Start     Ordered   02/19/22 0000  Diet - low sodium heart healthy        02/19/22 1109  Cardiac diet DISCHARGE MEDICATION: Allergies as of 02/19/2022       Reactions   Levetiracetam Other (See Comments)   Irritable, mood swings   Penicillins Rash   Has patient had a PCN reaction causing immediate rash, facial/tongue/throat swelling, SOB or lightheadedness with hypotension: Unknown Has patient had a PCN reaction causing severe rash involving mucus  membranes or skin necrosis: Unknown Has patient had a PCN reaction that required hospitalization: No Has patient had a PCN reaction occurring within the last 10 years: No If all of the above answers are "NO", then may proceed with Cephalosporin use.        Medication List     TAKE these medications    lisinopril 10 MG tablet Commonly known as: ZESTRIL Take 1 tablet (10 mg total) by mouth at bedtime. Start taking on: February 20, 2022 What changed: when to take this   loratadine 10 MG tablet Commonly known as: CLARITIN Take 10 mg by mouth daily as needed for allergies.        Subjective. Today, patient was seen and examined at bedside.  Feels better.  Denies any dizziness, lightheadedness, shortness of breath, chest pain or palpitation  Discharge Exam: Filed Weights   02/18/22 0257  Weight: 124.4 kg      02/19/2022   10:41 AM 02/19/2022    9:40 AM 02/19/2022    9:28 AM  Vitals with BMI  Systolic  172 191  Diastolic  98 106  Pulse 77     Body mass index is 39.36 kg/m.   General: Obese built, not in obvious distress HENT:   No scleral pallor or icterus noted. Oral mucosa is moist.  Chest:  Clear breath sounds.  Diminished breath sounds bilaterally. No crackles or wheezes.  CVS: S1 &S2 heard. No murmur.  Regular rate and rhythm. Abdomen: Soft, nontender, nondistended.  Bowel sounds are heard.   Extremities: No cyanosis, clubbing or edema.  Peripheral pulses are palpable. Psych: Alert, awake and oriented, normal mood CNS:  No cranial nerve deficits.  Power equal in all extremities.   Skin: Warm and dry.  No rashes noted.   Condition at discharge: good  The results of significant diagnostics from this hospitalization (including imaging, microbiology, ancillary and laboratory) are listed below for reference.   Imaging Studies: CT Angio Chest Pulmonary Embolism (PE) W or WO Contrast  Result Date: 02/18/2022 CLINICAL DATA:  Syncope EXAM: CT ANGIOGRAPHY CHEST WITH  CONTRAST TECHNIQUE: Multidetector CT imaging of the chest was performed using the standard protocol during bolus administration of intravenous contrast. Multiplanar CT image reconstructions and MIPs were obtained to evaluate the vascular anatomy. RADIATION DOSE REDUCTION: This exam was performed according to the departmental dose-optimization program which includes automated exposure control, adjustment of the mA and/or kV according to patient size and/or use of iterative reconstruction technique. CONTRAST:  7mL OMNIPAQUE IOHEXOL 350 MG/ML SOLN COMPARISON:  None Available. FINDINGS: Cardiovascular: No evidence of pulmonary embolus. Normal heart size. No pericardial effusion. No coronary artery calcifications. Normal caliber thoracic aorta with no evidence of atherosclerotic disease. Mediastinum/Nodes: Esophagus and thyroid are unremarkable. No pathologically enlarged nodes seen in the chest. Lungs/Pleura: Central airways are patent. Expiratory phase imaging with bilateral atelectasis. No consolidation, pleural effusion or pneumothorax. Small solid pulmonary nodule of the right upper lobe measuring 3 mm on series 5, image 32. Upper Abdomen: No acute abnormality. Musculoskeletal: No chest wall abnormality. No acute or significant osseous findings. Review of the MIP images confirms the above findings. IMPRESSION: 1.  No evidence of pulmonary embolus or acute airspace opacity. 2. Small solid pulmonary nodule of the right upper lobe measuring 3 mm. No follow-up needed if patient is low-risk.This recommendation follows the consensus statement: Guidelines for Management of Incidental Pulmonary Nodules Detected on CT Images: From the Fleischner Society 2017; Radiology 2017; 284:228-243. Electronically Signed   By: Allegra Lai M.D.   On: 02/18/2022 10:17   DG Chest Port 1 View  Result Date: 02/18/2022 CLINICAL DATA:  Possible sepsis EXAM: PORTABLE CHEST 1 VIEW COMPARISON:  08/26/2017 FINDINGS: Lungs are clear.  No  pleural effusion or pneumothorax. The heart is normal in size. IMPRESSION: No evidence of acute cardiopulmonary disease. Electronically Signed   By: Charline Bills M.D.   On: 02/18/2022 03:28    Microbiology: Results for orders placed or performed during the hospital encounter of 02/18/22  Blood Culture (routine x 2)     Status: None (Preliminary result)   Collection Time: 02/18/22  3:03 AM   Specimen: BLOOD  Result Value Ref Range Status   Specimen Description BLOOD LEFT AC  Final   Special Requests   Final    BOTTLES DRAWN AEROBIC AND ANAEROBIC Blood Culture results may not be optimal due to an excessive volume of blood received in culture bottles   Culture   Final    NO GROWTH 1 DAY Performed at [facility], [address] [place], [place] [address]    Report Status PENDING  Incomplete  Blood Culture (routine x 2)     Status: None (Preliminary result)   Collection Time: 02/18/22  3:03 AM   Specimen: BLOOD  Result Value Ref Range Status   Specimen Description BLOOD RIGHT [facility]  Final   Special Requests   Final    BOTTLES DRAWN AEROBIC AND ANAEROBIC Blood Culture adequate volume   Culture   Final    NO GROWTH 1 DAY Performed at [facility], [address] [place], [place] [address]    Report Status PENDING  Incomplete  Urine Culture     Status: Abnormal   Collection Time: 02/18/22  3:03 AM   Specimen: Urine, Random  Result Value Ref Range Status   Specimen Description   Final    URINE, RANDOM Performed at [facility], [address] [place], [place] [address]    Special Requests   Final    NONE Performed at [facility], [address] [place], [place] [address]    Culture MULTIPLE SPECIES PRESENT, SUGGEST RECOLLECTION (A)  Final   Report Status 02/19/2022 FINAL  Final  SARS Coronavirus 2 by RT PCR (hospital order, performed in [facility] hospital lab) *cepheid single result test* Anterior Nasal Swab     Status: None    Collection Time: 02/18/22  8:49 AM   Specimen: Anterior Nasal Swab  Result Value Ref Range Status   SARS Coronavirus 2 by RT PCR NEGATIVE NEGATIVE Final    Comment: (NOTE) SARS-CoV-2 target nucleic acids are NOT DETECTED.  The SARS-CoV-2 RNA is generally detectable in upper and lower respiratory specimens during the acute phase of infection. The lowest concentration of SARS-CoV-2 viral copies this assay can detect is 250 copies / mL. A negative result does not preclude SARS-CoV-2 infection and should not be used as the sole basis for treatment or other patient management decisions.  A negative result may occur with improper specimen collection / handling, submission of specimen other than nasopharyngeal swab, presence of viral mutation(s) within the areas targeted by  this assay, and inadequate number of viral copies (<250 copies / mL). A negative result must be combined with clinical observations, patient history, and epidemiological information.  Fact Sheet for Patients:   RoadLapTop.co.za  Fact Sheet for Healthcare Providers: http://kim-miller.com/  This test is not yet approved or  cleared by the [place] FDA and has been authorized for detection and/or diagnosis of SARS-CoV-2 by FDA under an Emergency Use Authorization (EUA).  This EUA will remain in effect (meaning this test can be used) for the duration of the COVID-19 declaration under Section 564(b)(1) of the Act, 21 U.S.C. section 360bbb-3(b)(1), unless the authorization is terminated or revoked sooner.  Performed at [facility], [address] [place], [place] [address]     Labs: CBC: Recent Labs  Lab 02/18/22 0303 02/19/22 0339  WBC 11.3* 10.4  NEUTROABS 7.2  --   HGB 14.6 13.9  HCT 43.8 41.9  MCV 87.8 88.0  PLT 278 231   Basic Metabolic Panel: Recent Labs  Lab 02/18/22 0303 02/19/22 0339  NA 141 137  K 3.4* 3.8  CL 102 109  CO2 26 21*   GLUCOSE 214* 121*  BUN 18 14  CREATININE 1.19 0.88  CALCIUM 9.5 8.3*  MG 2.1  --    Liver Function Tests: Recent Labs  Lab 02/18/22 0303  AST 36  ALT 54*  ALKPHOS 60  BILITOT 0.8  PROT 7.4  ALBUMIN 4.3   CBG: Recent Labs  Lab 02/19/22 1032  GLUCAP 168*    Discharge time spent: greater than 30 minutes.  Signed: Joycelyn Das, MD [facility] 02/19/2022

## 2022-02-19 NOTE — Progress Notes (Signed)
Admission profile updated. ?

## 2022-02-23 LAB — CULTURE, BLOOD (ROUTINE X 2)
Culture: NO GROWTH
Culture: NO GROWTH
Special Requests: ADEQUATE

## 2022-02-24 DIAGNOSIS — I10 Essential (primary) hypertension: Secondary | ICD-10-CM | POA: Diagnosis not present

## 2022-02-24 DIAGNOSIS — E782 Mixed hyperlipidemia: Secondary | ICD-10-CM | POA: Diagnosis not present

## 2022-02-24 DIAGNOSIS — Z09 Encounter for follow-up examination after completed treatment for conditions other than malignant neoplasm: Secondary | ICD-10-CM | POA: Diagnosis not present

## 2022-02-24 DIAGNOSIS — Z87898 Personal history of other specified conditions: Secondary | ICD-10-CM | POA: Diagnosis not present

## 2022-03-03 ENCOUNTER — Encounter: Payer: Self-pay | Admitting: Internal Medicine

## 2022-03-03 ENCOUNTER — Ambulatory Visit: Payer: BC Managed Care – PPO | Admitting: Internal Medicine

## 2022-03-03 VITALS — BP 146/93 | HR 75 | Ht 70.0 in | Wt 266.0 lb

## 2022-03-03 DIAGNOSIS — I4729 Other ventricular tachycardia: Secondary | ICD-10-CM

## 2022-03-03 DIAGNOSIS — Z79899 Other long term (current) drug therapy: Secondary | ICD-10-CM | POA: Diagnosis not present

## 2022-03-03 DIAGNOSIS — R55 Syncope and collapse: Secondary | ICD-10-CM

## 2022-03-03 DIAGNOSIS — I10 Essential (primary) hypertension: Secondary | ICD-10-CM

## 2022-03-03 MED ORDER — LISINOPRIL-HYDROCHLOROTHIAZIDE 10-12.5 MG PO TABS: 1.0000 | ORAL_TABLET | Freq: Every day | ORAL | 3 refills | Status: DC

## 2022-03-03 NOTE — Patient Instructions (Addendum)
Medication Instructions:  - Your physician has recommended you make the following change in your medication:   1) STOP Lisinopril   2) START Lisinopril- HCTZ 10-12.5 mg: - take 1 tablet by mouth once daily   *If you need a refill on your cardiac medications before your next appointment, please call your pharmacy*   Lab Work: - Your physician recommends that you return for lab work in: 2 weeks (around 03/17/22)  BMP  Medical Mall Entrance at [facility] 1st desk on the right to check in (REGISTRATION)  Lab hours: Monday- Friday (7:30 am- 5:30 pm)   If you have labs (blood work) drawn today and your tests are completely normal, you will receive your results only by: MyChart Message (if you have MyChart) OR A paper copy in the mail If you have any lab test that is abnormal or we need to change your treatment, we will call you to review the results.   Testing/Procedures: - none ordered   Follow-Up: At [facility], you and your health needs are our priority.  As part of our continuing mission to provide you with exceptional heart care, we have created designated Provider Care Teams.  These Care Teams include your primary Cardiologist (physician) and Advanced Practice Providers (APPs -  Physician Assistants and Nurse Practitioners) who all work together to provide you with the care you need, when you need it.  We recommend signing up for the patient portal called "MyChart".  Sign up information is provided on this After Visit Summary.  MyChart is used to connect with patients for Virtual Visits (Telemedicine).  Patients are able to view lab/test results, encounter notes, upcoming appointments, etc.  Non-urgent messages can be sent to your provider as well.   To learn more about what you can do with MyChart, go to [email or web address].    Your next appointment:   1 year(s)  The format for your next appointment:   In Person  Provider:   Sherryl Manges, MD    Other  Instructions  Lisinopril; Hydrochlorothiazide Tablets What is this medication? LISINOPRIL; HYDROCHLOROTHIAZIDE (lyse IN oh pril; hye droe klor oh THYE a zide) treats high blood pressure. It relaxes your blood vessels and helps your kidneys remove more fluid through the urine, which lowers blood pressure. This medication is a combination of an ACE inhibitor and diuretic. This medicine may be used for other purposes; ask your health care provider or pharmacist if you have questions. COMMON BRAND NAME(S): Prinzide, Zestoretic What should I tell my care team before I take this medication? They need to know if you have any of these conditions: Bone marrow disease Decreased urine Diabetes Heart or blood vessel disease If you are on a special diet like a low salt diet Immune system problems, like lupus Kidney disease Liver disease Previous swelling of the tongue, face, or lips with difficulty breathing, difficulty swallowing, hoarseness, or tightening of the throat Recent heart attack or stroke An unusual or allergic reaction to lisinopril, hydrochlorothiazide, sulfa medications, other medications, insect venom, foods, dyes, or preservatives Pregnant or trying to get pregnant Breast-feeding How should I use this medication? Take this medication by mouth. Take it as directed on the prescription label at the same time every day. You can take it with or without food. If it upsets your stomach, take it with food. Keep taking it unless your care team tells you to stop. Talk to your care team about the use of this drug in children. Special care may be needed.  Overdosage: If you think you have taken too much of this medicine contact a poison control center or emergency room at once. NOTE: This medicine is only for you. Do not share this medicine with others. What if I miss a dose? If you miss a dose, take it as soon as you can. If it is almost time for your next dose, take only that dose. Do not take  double or extra doses. What may interact with this medication? Do not take this medication with any of the following: Cidofovir Dofetilide Sacubitril; valsartan Tranylcypromine This medication may also interact with the following: Barbiturates like phenobarbital Blood pressure medications Celecoxib Corticosteroids like prednisone Diuretics, especially triamterene, spironolactone or amiloride Lithium Medications for diabetes Medications that relax the muscles for surgery Narcotic medications for pain NSAIDs, medications for pain and inflammation, like ibuprofen or naproxen Potassium salts or potassium supplements Some cholesterol lowering medications like cholestyramine or colestipol This list may not describe all possible interactions. Give your health care provider a list of all the medicines, herbs, non-prescription drugs, or dietary supplements you use. Also tell them if you smoke, drink alcohol, or use illegal drugs. Some items may interact with your medicine. What should I watch for while using this medication? Visit your care team for regular checks on your progress. Check your blood pressure as directed. Ask your care team what your blood pressure should be and when you should contact him or her. Call your care team if you notice an irregular or fast heartbeat. You must not get dehydrated. Ask your care team how much fluid you need to drink a day. Check with them if you get an attack of severe diarrhea, nausea and vomiting, or if you sweat a lot. The loss of too much body fluid can make it dangerous for you to take this medication. Women should inform their care team if they wish to become pregnant or think they might be pregnant. There is a potential for serious side effects to an unborn child. Talk to your care team or pharmacist for more information. You may get drowsy or dizzy. Do not drive, use machinery, or do anything that needs mental alertness until you know how this  medication affects you. Do not stand or sit up quickly, especially if you are an older patient. This reduces the risk of dizzy or fainting spells. Alcohol can make you more drowsy and dizzy. Avoid alcoholic drinks. This medication may increase blood sugar. Ask your care team if changes in diet or medications are needed if you have diabetes. Avoid salt substitutes unless you are told otherwise by your care team. Talk to your care team about your risk of skin cancer. You may be more at risk for skin cancer if you take this medication. This medication can make you more sensitive to the sun. Keep out of the sun. If you cannot avoid being in the sun, wear protective clothing and use sunscreen. Do not use sun lamps or tanning beds/booths. Do not treat yourself for coughs, colds, or pain while you are taking this medication without asking your care team for advice. Some ingredients may increase your blood pressure. What side effects may I notice from receiving this medication? Side effects that you should report to your care team as soon as possible: Allergic reactions or angioedema--skin rash, itching or hives, swelling of the face eyes, lips, tongue, arms, or legs, trouble swallowing or breathing Dehydration--increased thirst, dry mouth, feeling faint or lightheaded, headache, dark yellow or brown urine  Gout--severe pain, redness, warmth, or swelling in joints, such as the big toe Kidney injury--decrease in the amount of urine, swelling of the ankles, hands, or feet Liver injury--right upper belly pain, loss of appetite, nausea, light-colored stool, dark yellow or brown urine, yellowing skin or eyes, unusual weakness or fatigue Low blood pressure--dizziness, feeling faint or lightheaded, blurry vision Low potassium level--muscle pain or cramps, unusual weakness, fatigue, fast or irregular heartbeat, constipation Sudden eye pain or change in vision such as blurred vision, seeing halos around lights, vision  loss Side effects that usually do not require medical attention (report to your care team if they continue or are bothersome): Change in sex drive or performance Cough Dizziness Headache Upset stomach This list may not describe all possible side effects. Call your doctor for medical advice about side effects. You may report side effects to FDA at 1-800-FDA-1088. Where should I keep my medication? Keep out of the reach of children and pets. Store at room temperature between 20 and 25 degrees C (68 and 77 degrees F). Protect from light and moisture. Keep the container tightly closed. Throw away any unused medication after the expiration date. NOTE: This sheet is a summary. It may not cover all possible information. If you have questions about this medicine, talk to your doctor, pharmacist, or health care provider.  2023 Elsevier/Gold Standard (2021-06-27 00:00:00)   Important Information About Sugar

## 2022-03-03 NOTE — Progress Notes (Signed)
Patient Care Team: Mickey Farber, MD as PCP - General (Internal Medicine) Antonieta Iba, MD as PCP - Cardiology (Cardiology)   HPI  Maxwell Davis is a 56 y.o. male Seen in followup for syncope, presumed neurally mediated, and VT NS  Interval hospitalization because of syncope following defecation.   Stereotypical prodrome although the flushing was much more severe.  Did not respond to the ceiling fan.  Blood pressure on arrival per EMS was 80s/30s and persisted for more than about 10 minutes.  Lost control of his bowels once they arrived.  Also HTN, Morbidly obese and weight is up about 10 pounds.  He has been walking more and eating less his weight issues have stabilized.   Blood pressures at home have been in the 125-160 range  Works Administrator Reviewed   Past Medical History:  Diagnosis Date   Essential hypertension    Hyperlipidemia    Morbid obesity (HCC)    OSA (obstructive sleep apnea)    Seizures (HCC)    a. On dilantin.  Last seizure 1995.    Past Surgical History:  Procedure Laterality Date   COLONOSCOPY WITH PROPOFOL N/A 06/10/2015   Procedure: COLONOSCOPY WITH PROPOFOL;  Surgeon: Christena Deem, MD;  Location: [facility] ENDOSCOPY;  Service: Endoscopy;  Laterality: N/A;   right leg surgery     for tibial fracture    Current Meds  Medication Sig   lisinopril (ZESTRIL) 10 MG tablet Take 1 tablet (10 mg total) by mouth at bedtime.   loratadine (CLARITIN) 10 MG tablet Take 10 mg by mouth daily as needed for allergies.    Allergies  Allergen Reactions   Levetiracetam Other (See Comments)    Irritable, mood swings   Penicillins Rash    Has patient had a PCN reaction causing immediate rash, facial/tongue/throat swelling, SOB or lightheadedness with hypotension: Unknown Has patient had a PCN reaction causing severe rash involving mucus membranes or skin necrosis: Unknown Has patient had a PCN reaction that required  hospitalization: No Has patient had a PCN reaction occurring within the last 10 years: No If all of the above answers are "NO", then may proceed with Cephalosporin use.       Review of Systems negative except from HPI and PMH  Physical Exam BP (!) 146/93 (BP Location: Left Arm, Patient Position: Sitting, Cuff Size: Large)   Pulse 75   Ht 5\' 10"  (1.778 m)   Wt 266 lb (120.7 kg)   SpO2 98%   BMI 38.17 kg/m  Well developed and nourished in no acute distress HENT normal Neck supple with JVP-  flat   Clear Regular rate and rhythm, no murmurs or gallops Abd-soft with active BS No Clubbing cyanosis edema Skin-warm and dry A & Oriented  Grossly normal sensory and motor function  ECG sinus at 75 Interval 16/10/40  Estimated Creatinine Clearance: 120.6 mL/min (by C-G formula based on SCr of 0.88 mg/dL).   Assessment and  Plan  Ventricular tachycardia-nonsustained   Syncope-vasovagal  Hypertension  Sleep apnea treated   Blood pressure remains poorly controlled.  We will add hydrochlorothiazide 12.5 to his lisinopril 10 and we will recheck his metabolic profile in about 2 weeks.  Syncope is consistent with neurally mediated with protracted hypotension as noted by the EMS.  Discussed the physiology and the potential benefits of raising his legs in addition to being supine.  He tells me he has a electrical bed. It  is possible that the episode was triggered by his arrhythmia; however, the context of occurring with his bowels is typical for him and so I think it is unlikely  His sleep apnea therapy is moving along with a replaced device following recall; however, his follow-up with pulmonary fell off and we will have him reschedule

## 2022-03-19 ENCOUNTER — Other Ambulatory Visit
Admission: RE | Admit: 2022-03-19 | Discharge: 2022-03-19 | Disposition: A | Discharge status: Home / Self Care | Payer: BC Managed Care – PPO | Attending: Internal Medicine | Admitting: Internal Medicine

## 2022-03-19 DIAGNOSIS — I10 Essential (primary) hypertension: Secondary | ICD-10-CM | POA: Insufficient documentation

## 2022-03-19 DIAGNOSIS — Z79899 Other long term (current) drug therapy: Secondary | ICD-10-CM | POA: Diagnosis not present

## 2022-03-19 LAB — BASIC METABOLIC PANEL
Anion gap: 10 (ref 5–15)
BUN: 19 mg/dL (ref 6–20)
CO2: 25 mmol/L (ref 22–32)
Calcium: 9.2 mg/dL (ref 8.9–10.3)
Chloride: 103 mmol/L (ref 98–111)
Creatinine, Ser: 1.01 mg/dL (ref 0.61–1.24)
GFR, Estimated: >60 mL/min (ref >60)
Glucose, Bld: 103 mg/dL — ABNORMAL HIGH (ref 70–99)
Potassium: 3.5 mmol/L (ref 3.5–5.1)
Sodium: 138 mmol/L (ref 135–145)

## 2022-06-03 ENCOUNTER — Ambulatory Visit: Payer: BC Managed Care – PPO | Admitting: Primary Care

## 2022-06-03 ENCOUNTER — Telehealth: Payer: Self-pay

## 2022-06-03 ENCOUNTER — Encounter: Payer: Self-pay | Admitting: Primary Care

## 2022-06-03 DIAGNOSIS — G4733 Obstructive sleep apnea (adult) (pediatric): Secondary | ICD-10-CM

## 2022-06-03 NOTE — Patient Instructions (Signed)
Continue to wear CPAP every night  Continue to work on weight loss efforts as you have been Do not drive if experiencing excessive daytime sleepiness  Please bring SD card by [facility] for them to access data from your card and fax to me for review  Follow-up  1 year with [facility] NP for sooner if needed

## 2022-06-03 NOTE — Telephone Encounter (Signed)
Spoke to [place] with [facility] and requested download. She stated that she will end request to RT and fax report to our office.  Will await fax.

## 2022-06-03 NOTE — Progress Notes (Addendum)
@Patient  ID: , male    DOB: September 29, 1965, 56 y.o.   MRN: [id number]  Chief Complaint  Patient presents with   Follow-up    Wearing cpap avg 5-6hr nightly. Pressure and mask is okay.     Referring provider: [id number], MD  HPI: 56 year old male, never smoked. PMH significant for HTN, NSVT, seizure disorder, sleep apnea, morbid obesity.   Previous [facility] pulmonary encounter:  06/25/2021 Patient presents today for sleep consult, referred by Dr. 06/27/2021 with cardiology for hx sleep apnea. Appears he has been on CPAP for several years, he reports compliance with use. No data on SD card. From what I gather he had repeat sleep [facility] in 2016 with [facility], we will need to request this results. Previously managed by Dr. 2017 who is now retired. He wears CPAP on average 5-6 hours a night. He is unsure pressure setting. He uses full face mask. He typically sleeps through the night without issue. He uses [employer] dream station machine which was recalled, he was contacted by the company in October 2022 to get some more information. Denies narcolepsy, cataplexy or sleep walking.   Compliance review from Phone - Nov 7th 2022 Usage 5 hours 40 min Pressure 12-17cm h20 (Average pressure 12cm) AHI 3.7  Sleep questionnaire Prior sleep study- Sleep study in 2016 with [facility]  Symptoms- Snoring, restless sleep, daytime sleepiness Bedtime- 9pm-12am Time to fall asleep- not long Nocturnal awakenings- none Out of bed in morning- 5am Weight changes- gained 10 lbs  Epworth- 11  09/24/2021 Patient presents today for 3 month follow-up. Sleep apnea was previously managed by Dr. 09/26/2021. He established with our office in November 2022 for OSA. He had a [facility] in [facility] which showed severe OSA, AHI 55.9/hr with SpO2 low 89%. Patient reports compliance with CPAP use. No issues with mask fit or pressure setting. He brought in SD card with him but unable to access download.  Average usage on [employer] dream wear app was 5 hours 30 mins. Current pressure setting 12-17cm h20 (15cm h20-90th%); AHI 4.6. Mask seal score was poor. Advised he change out current mask. CPAP machine was recalled by [employer], he has not received a new one.  Needs to bring SD card by [facility] for compliance download.   06/03/2022 - interim hx  Patient presents today for OSA follow-up. He is doing well, no issues with mask fit or pressure settings. He is sleeping well at night. No residual daytime sleepiness.  No compliance report available on airview. He will need to bring SD card by [facility]. Since July he has had no issues with SVT, he is still following with cardiology and taking lisinopril- HCTZ.   Download Dreamstation 03/27/21-09/23/21 Average usage days used 4 hours 50 mins  Average total leak 72.5L/min  Average AHI 10.3  Allergies  Allergen Reactions   Levetiracetam Other (See Comments)    Irritable, mood swings   Penicillins Rash    Has patient had a PCN reaction causing immediate rash, facial/tongue/throat swelling, SOB or lightheadedness with hypotension: Unknown Has patient had a PCN reaction causing severe rash involving mucus membranes or skin necrosis: Unknown Has patient had a PCN reaction that required hospitalization: No Has patient had a PCN reaction occurring within the last 10 years: No If all of the above answers are "NO", then may proceed with Cephalosporin use.     Immunization History  Administered Date(s) Administered   Tdap 03/27/2015  Past Medical History:  Diagnosis Date   Essential hypertension    Hyperlipidemia    Morbid obesity ([place])    OSA (obstructive sleep apnea)    Seizures ([place])    a. On dilantin.  Last seizure 1995.    Tobacco History: Social History   Tobacco Use  Smoking Status Never  Smokeless Tobacco Never   Counseling given: Not Answered   Outpatient Medications Prior to Visit  Medication Sig Dispense Refill    lisinopril-hydrochlorothiazide (ZESTORETIC) 10-12.5 MG tablet Take 1 tablet by mouth daily. 90 tablet 3   loratadine (CLARITIN) 10 MG tablet Take 10 mg by mouth daily as needed for allergies.     No facility-administered medications prior to visit.    Review of Systems  Review of Systems  Constitutional: Negative.   HENT: Negative.    Respiratory: Negative.    Cardiovascular: Negative.    Physical Exam  BP 126/72 (BP Location: Left Arm, Cuff Size: Large)   Pulse 72   Temp (!) 97.5 F (36.4 C) (Temporal)   Ht 5\' 10"  (1.778 m)   Wt 260 lb (117.9 kg)   SpO2 98%   BMI 37.31 kg/m  Physical Exam Constitutional:      Appearance: Normal appearance.  HENT:     Head: Normocephalic and atraumatic.  Cardiovascular:     Rate and Rhythm: Normal rate and regular rhythm.  Pulmonary:     Effort: Pulmonary effort is normal.     Breath sounds: Normal breath sounds.  Musculoskeletal:        General: Normal range of motion.  Skin:    General: Skin is warm and dry.  Neurological:     General: No focal deficit present.     Mental Status: He is alert and oriented to person, place, and time. Mental status is at baseline.  Psychiatric:        Mood and Affect: Mood normal.        Behavior: Behavior normal.        Thought Content: Thought content normal.        Judgment: Judgment normal.      Lab Results:  CBC    Component Value Date/Time   WBC 10.4 02/19/2022 0339   RBC 4.76 02/19/2022 0339   HGB 13.9 02/19/2022 0339   HCT 41.9 02/19/2022 0339   PLT 231 02/19/2022 0339   MCV 88.0 02/19/2022 0339   MCH 29.2 02/19/2022 0339   MCHC 33.2 02/19/2022 0339   RDW 12.1 02/19/2022 0339   LYMPHSABS 3.5 02/18/2022 0303   MONOABS 0.3 02/18/2022 0303   EOSABS 0.2 02/18/2022 0303   BASOSABS 0.0 02/18/2022 0303    BMET    Component Value Date/Time   NA 138 03/19/2022 1225   K 3.5 03/19/2022 1225   CL 103 03/19/2022 1225   CO2 25 03/19/2022 1225   GLUCOSE 103 (H) 03/19/2022 1225    BUN 19 03/19/2022 1225   CREATININE 1.01 03/19/2022 1225   CALCIUM 9.2 03/19/2022 1225   GFRNONAA >60 03/19/2022 1225   GFRAA >60 02/28/2020 0854    BNP No results found for: "BNP"  ProBNP No results found for: "PROBNP"  Imaging: No results found.   Assessment & Plan:   Obstructive sleep apnea on CPAP - [facility] showed severe obstructive sleep apnea, AHI 55.9 an hour. Patient reports compliance with CPAP use nightly.  There was no compliance report available in [place] today, patient will need to bring CPAP by [facility] for download. Advised patient continue to wear CPAP every night, work on weight loss efforts. Follow-up in 1 year with Waynetta Sandy NP for sooner if needed    Glenford Bayley, NP 06/08/2022

## 2022-06-04 NOTE — Telephone Encounter (Signed)
Download has been received from [facility] and faxed to [facility] office for review. Download stops 09/23/2021.

## 2022-06-08 DIAGNOSIS — Z8679 Personal history of other diseases of the circulatory system: Secondary | ICD-10-CM | POA: Diagnosis not present

## 2022-06-08 DIAGNOSIS — I10 Essential (primary) hypertension: Secondary | ICD-10-CM | POA: Diagnosis not present

## 2022-06-08 DIAGNOSIS — G4733 Obstructive sleep apnea (adult) (pediatric): Secondary | ICD-10-CM | POA: Diagnosis not present

## 2022-06-08 DIAGNOSIS — K760 Fatty (change of) liver, not elsewhere classified: Secondary | ICD-10-CM | POA: Diagnosis not present

## 2022-06-08 DIAGNOSIS — E669 Obesity, unspecified: Secondary | ICD-10-CM | POA: Diagnosis not present

## 2022-06-08 DIAGNOSIS — R972 Elevated prostate specific antigen [PSA]: Secondary | ICD-10-CM | POA: Diagnosis not present

## 2022-06-08 DIAGNOSIS — Z1331 Encounter for screening for depression: Secondary | ICD-10-CM | POA: Diagnosis not present

## 2022-06-08 DIAGNOSIS — E782 Mixed hyperlipidemia: Secondary | ICD-10-CM | POA: Diagnosis not present

## 2022-06-08 DIAGNOSIS — R7302 Impaired glucose tolerance (oral): Secondary | ICD-10-CM | POA: Diagnosis not present

## 2022-06-08 DIAGNOSIS — Z0000 Encounter for general adult medical examination without abnormal findings: Secondary | ICD-10-CM | POA: Diagnosis not present

## 2022-06-08 NOTE — Telephone Encounter (Signed)
Order placed for replacement.  Patient is aware of below message/recommendations and voiced his understanding.  Nothing further needed.

## 2022-06-08 NOTE — Assessment & Plan Note (Signed)
-   [facility] study in 2016 at [facility] showed severe obstructive sleep apnea, AHI 55.9 an hour. Patient reports compliance with CPAP use nightly.  There was no compliance report available in [place] today, patient will need to bring CPAP by [facility] for download. Advised patient continue to wear CPAP every night, work on weight loss efforts. Follow-up in 1 year with [facility] NP for sooner if needed

## 2022-06-08 NOTE — Telephone Encounter (Signed)
Called and spoke to patient and relayed below message/recommendations.  He feels that his mask is leaking when he turns over to his side.  He would like a new machine.   Beth, please advise on settings? Thanks

## 2022-06-08 NOTE — Telephone Encounter (Signed)
Pressure setting 12-17cm h20; ok to send order for new machine. He should be changing mask every 3 months, may want to look at getting CPAP pillow for side sleepers  If continues to have airleaks should get mask fitting

## 2022-06-08 NOTE — Telephone Encounter (Signed)
Received download from [employer] between dates 03/27/2021 - 09/23/2021. Average usage 4 hours 50 minutes.  Average total leak 72.5 L/min.  Average AHI 10.3.  Patient was doing well when I saw him at his visit and reported no issues with mask fit or pressure settings.   Is he having any issues with air leaks?  Does he need a new CPAP machine?  Sure he wear CPAP every night for minimum 4 to 6 hours.  Make sure ST card is in machine.    Please renew CPAP supplies with [facility].

## 2022-08-05 DIAGNOSIS — G4733 Obstructive sleep apnea (adult) (pediatric): Secondary | ICD-10-CM | POA: Diagnosis not present

## 2022-09-05 DIAGNOSIS — G4733 Obstructive sleep apnea (adult) (pediatric): Secondary | ICD-10-CM | POA: Diagnosis not present

## 2022-10-06 DIAGNOSIS — G4733 Obstructive sleep apnea (adult) (pediatric): Secondary | ICD-10-CM | POA: Diagnosis not present

## 2022-11-04 DIAGNOSIS — G4733 Obstructive sleep apnea (adult) (pediatric): Secondary | ICD-10-CM | POA: Diagnosis not present

## 2023-01-18 DIAGNOSIS — I10 Essential (primary) hypertension: Secondary | ICD-10-CM | POA: Diagnosis not present

## 2023-01-18 DIAGNOSIS — R7303 Prediabetes: Secondary | ICD-10-CM | POA: Diagnosis not present

## 2023-01-18 DIAGNOSIS — H35033 Hypertensive retinopathy, bilateral: Secondary | ICD-10-CM | POA: Diagnosis not present

## 2023-01-18 DIAGNOSIS — H2513 Age-related nuclear cataract, bilateral: Secondary | ICD-10-CM | POA: Diagnosis not present

## 2023-02-08 NOTE — Progress Notes (Signed)
Cardiology Office Note Date:  02/09/2023  Patient ID:  Maxwell, Davis 1965/12/27, MRN [id number] PCP:  Mickey Farber, MD  Cardiologist:  Julien Nordmann, MD Electrophysiologist: Sherryl Manges, MD    Chief Complaint: 1 year follow-up  History of Present Illness: Maxwell Davis is a 57 y.o. male with PMH notable for NSVT, vasovagal syncope, HTN, OSA; seen today for Sherryl Manges, MD for routine electrophysiology followup.  Last saw Dr. Graciela Husbands 02/2022 where he had recently syncopized following defecation, was found to be quite hypotensive 80/30s for ~10 minutes. He was HTN at visit. Dr. Graciela Husbands added 12.5mg  hydrochlorothiazide.   On follow-up today, patient states that he has had 1 episode of near syncope correlating with defecation and last being seen.  He continues to check blood pressure at home, most readings in the 130s/80s.  His wife was recently diagnosed as prediabetic, and so they have been made dietary changes at home, tryingto cut out sweets and late night snacking. He has received a new CPAP machine, uses it diligently nightly.    he denies chest pain, palpitations, dyspnea, PND, orthopnea, nausea, vomiting, dizziness, edema, weight gain, or early satiety.     Past Medical History:  Diagnosis Date   Essential hypertension    Hyperlipidemia    Morbid obesity (HCC)    OSA (obstructive sleep apnea)    Seizures (HCC)    a. On dilantin.  Last seizure 1995.    Past Surgical History:  Procedure Laterality Date   COLONOSCOPY WITH PROPOFOL N/A 06/10/2015   Procedure: COLONOSCOPY WITH PROPOFOL;  Surgeon: Christena Deem, MD;  Location: [facility] ENDOSCOPY;  Service: Endoscopy;  Laterality: N/A;   right leg surgery     for tibial fracture    Current Outpatient Medications  Medication Instructions   aspirin EC (ASPIRIN 81) 81 mg, Oral, Daily   lisinopril-hydrochlorothiazide (ZESTORETIC) 10-12.5 MG tablet 1 tablet, Oral, Daily   loratadine (CLARITIN) 10 mg, Oral, Daily PRN     Social History:  The patient  reports that he has never smoked. He has never used smokeless tobacco. He reports that he does not drink alcohol and does not use drugs.   Family History:  The patient's family history includes CAD in his maternal grandfather and maternal grandmother; Diabetes in his mother; Heart attack in his brother; Hypertension in his mother; Other in his sister; Prostate cancer in his father.  ROS:  Please see the history of present illness. All other systems are reviewed and otherwise negative.   PHYSICAL EXAM:  VS:  BP 130/78 (BP Location: Left Arm, Patient Position: Sitting, Cuff Size: Large)   Pulse 75   Ht 5\' 11"  (1.803 m)   Wt 265 lb 6.4 oz (120.4 kg)   SpO2 98%   BMI 37.02 kg/m  BMI: Body mass index is 37.02 kg/m.  GEN- The patient is well appearing, alert and oriented x 3 today.   Lungs- Clear to ausculation bilaterally, normal work of breathing.  Heart- Regular rate and rhythm, no murmurs, rubs or gallops Extremities- Trace peripheral edema, warm, dry   EKG is ordered. Personal review of EKG from today shows:    EKG Interpretation Date/Time:  Tuesday February 09 2023 10:58:47 EDT Ventricular Rate:  75 PR Interval:  148 QRS Duration:  98 QT Interval:  384 QTC Calculation: 428 R Axis:   0  Text Interpretation: Normal sinus rhythm Normal ECG Confirmed by Sherie Don [phone]) on 02/09/2023 11:56:44 AM    Recent Labs: 02/18/2022: ALT 54;  Magnesium 2.1 02/19/2022: Hemoglobin 13.9; Platelets 231 03/19/2022: BUN 19; Creatinine, Ser 1.01; Potassium 3.5; Sodium 138  02/19/2022: Cholesterol 136; HDL 31; LDL Cholesterol 76; Total CHOL/HDL Ratio 4.4; Triglycerides 144; VLDL 29   CrCl cannot be calculated (Patient's most recent lab result is older than the maximum 21 days allowed.).   Wt Readings from Last 3 Encounters:  02/09/23 265 lb 6.4 oz (120.4 kg)  06/03/22 260 lb (117.9 kg)  03/03/22 266 lb (120.7 kg)     Additional studies reviewed  include: Previous EP, cardiology notes.   Long term monitor, 10/18/2017 Normal sinus rhythm 9 beat run of nonsustained VT, Heart rate 185 bpm, patient activated Episodes of sinus tachycardia No other significant arrhythmia noted  TTE, 08/26/2017 - Left ventricle: The cavity size was normal. Systolic function was normal. The estimated ejection fraction was in the range of 60% to 65%. Wall motion was normal; there were no regional wall motion abnormalities. Left ventricular diastolic function parameters were normal.  - Left atrium: The atrium was normal in size.  - Right ventricle: Systolic function was normal.  - Pulmonary arteries: Systolic pressure was within the normal range.   ASSESSMENT AND PLAN:  #) NSVT No further episodes No palpitations  #) vasovagal syncope Continues to have rare episodes, about 1/year.  Always associated with defecation. Do not believe syncopal episodes triggered by arrhythmia at this time  #) HTN Well-controlled in office today and on home measurements. Continue dietary adjustments Increase physical activity as able  #) OSA Encourage nightly use    Current medicines are reviewed at length with the patient today.   The patient does not have concerns regarding his medicines.  The following changes were made today:  none  Labs/ tests ordered today include:  Orders Placed This Encounter  Procedures   EKG 12-Lead     Disposition: Follow up with Dr. Graciela Husbands or EP APP in 12 months   Signed, Sherie Don, NP  02/09/23  11:56 AM  Electrophysiology [facility]

## 2023-02-09 ENCOUNTER — Encounter: Payer: Self-pay | Admitting: Cardiology

## 2023-02-09 ENCOUNTER — Ambulatory Visit: Payer: BC Managed Care – PPO | Attending: Cardiology | Admitting: Cardiology

## 2023-02-09 VITALS — BP 130/78 | HR 75 | Ht 71.0 in | Wt 265.4 lb

## 2023-02-09 DIAGNOSIS — R55 Syncope and collapse: Secondary | ICD-10-CM

## 2023-02-09 DIAGNOSIS — G4733 Obstructive sleep apnea (adult) (pediatric): Secondary | ICD-10-CM | POA: Diagnosis not present

## 2023-02-09 DIAGNOSIS — G4730 Sleep apnea, unspecified: Secondary | ICD-10-CM

## 2023-02-09 DIAGNOSIS — I4729 Other ventricular tachycardia: Secondary | ICD-10-CM

## 2023-02-09 DIAGNOSIS — I10 Essential (primary) hypertension: Secondary | ICD-10-CM | POA: Diagnosis not present

## 2023-02-09 NOTE — Patient Instructions (Addendum)
Medication Instructions Your physician recommends that you continue on your current medications as directed. Please refer to the Current Medication list given to you today.  Lab Work: None If you have labs (blood work) drawn today and your tests are completely normal, you will receive your results only by: MyChart Message (if you have MyChart) OR A paper copy in the mail If you have any lab test that is abnormal or we need to change your treatment, we will call you to review the results.   Testing/Procedures: None   Follow-Up: At [facility], you and your health needs are our priority.  As part of our continuing mission to provide you with exceptional heart care, we have created designated Provider Care Teams.  These Care Teams include your primary Cardiologist (physician) and Advanced Practice Providers (APPs -  Physician Assistants and Nurse Practitioners) who all work together to provide you with the care you need, when you need it.  We recommend signing up for the patient portal called "MyChart".  Sign up information is provided on this After Visit Summary.  MyChart is used to connect with patients for Virtual Visits (Telemedicine).  Patients are able to view lab/test results, encounter notes, upcoming appointments, etc.  Non-urgent messages can be sent to your provider as well.   To learn more about what you can do with MyChart, go to [email or web address].    Your next appointment:   1 year(s)  Provider:   Sherryl Manges, MD or Sherie Don, NP

## 2023-05-10 ENCOUNTER — Other Ambulatory Visit: Payer: Self-pay | Admitting: Internal Medicine

## 2023-06-08 ENCOUNTER — Encounter: Payer: Self-pay | Admitting: Primary Care

## 2023-06-08 ENCOUNTER — Telehealth: Payer: BC Managed Care – PPO | Admitting: Primary Care

## 2023-06-08 DIAGNOSIS — G4733 Obstructive sleep apnea (adult) (pediatric): Secondary | ICD-10-CM

## 2023-06-08 NOTE — Progress Notes (Signed)
Virtual Visit via Video Note  I connected with Maxwell Davis on 06/08/23 at  9:00 AM EDT by a video enabled telemedicine application and verified that I am speaking with the correct person using two identifiers.  Location: Patient: Home  Provider: Office    I discussed the limitations of evaluation and management by telemedicine and the availability of in person appointments. The patient expressed understanding and agreed to proceed.  History of Present Illness: 57 year old male, never smoked. PMH significant for HTN, NSVT, seizure disorder, sleep apnea, morbid obesity.   Previous [facility] pulmonary encounter:  06/25/2021 Patient presents today for sleep consult, referred by Dr. Graciela Husbands with cardiology for hx sleep apnea. Appears he has been on CPAP for several years, he reports compliance with use. No data on SD card. From what I gather he had repeat sleep [facility] in 2016 with [facility], we will need to request this results. Previously managed by Dr. Harrington Challenger who is now retired. He wears CPAP on average 5-6 hours a night. He is unsure pressure setting. He uses full face mask. He typically sleeps through the night without issue. He uses [employer] dream station machine which was recalled, he was contacted by the company in October 2022 to get some more information. Denies narcolepsy, cataplexy or sleep walking.   Compliance review from Phone - Nov 7th 2022 Usage 5 hours 40 min Pressure 12-17cm h20 (Average pressure 12cm) AHI 3.7  Sleep questionnaire Prior sleep study- Sleep study in 2016 with [facility]  Symptoms- Snoring, restless sleep, daytime sleepiness Bedtime- 9pm-12am Time to fall asleep- not long Nocturnal awakenings- none Out of bed in morning- 5am Weight changes- gained 10 lbs  Epworth- 11  09/24/2021 Patient presents today for 3 month follow-up. Sleep apnea was previously managed by Dr. Harrington Challenger. He established with our office in November 2022 for OSA. He had a [facility] which showed severe OSA, AHI 55.9/hr with SpO2 low 89%. Patient reports compliance with CPAP use. No issues with mask fit or pressure setting. He brought in SD card with him but unable to access download. Average usage on [employer] dream wear app was 5 hours 30 mins. Current pressure setting 12-17cm h20 (15cm h20-90th%); AHI 4.6. Mask seal score was poor. Advised he change out current mask. CPAP machine was recalled by [employer], he has not received a new one.  Needs to bring SD card by [facility] for compliance download.   06/03/2022  Patient presents today for OSA follow-up. He is doing well, no issues with mask fit or pressure settings. He is sleeping well at night. No residual daytime sleepiness.  No compliance report available on airview. He will need to bring SD card by [facility]. Since July he has had no issues with SVT, he is still following with cardiology and taking lisinopril- HCTZ.   Download [employer]station 03/27/21-09/23/21 Average usage days used 4 hours 50 mins  Average total leak 72.5L/min  Average AHI 10.3  06/08/2023- Interim hx  Patient contacted today for OSA follow-up  He is sleeping well at night Reports compliance with CPAP use  No issues with pressure setting or mask  He uses full face mask He got his CPAP machine through [facility] and ordered supplies through [employer] He received new resmed CPAP machine in the last year  No daytime sleepiness   Observations/Objective:  Feels well; No Respiratory symptoms  Assessment and Plan:  OSA - Patient reports compliance with CPAP nightly and  benefit from use - Wearing full face mask. No issues with mask fit or pressure settings  - No compliance report available, asked patient bring CPAP machine by [facility] for download. We will place DME order for patient to be enrolled in [employer].  - Advised patient continue to wear CPAP nightly 4-6 hours or longer  Follow Up Instructions:  1 year with Waynetta Sandy NP or  sooner   I discussed the assessment and treatment plan with the patient. The patient was provided an opportunity to ask questions and all were answered. The patient agreed with the plan and demonstrated an understanding of the instructions.   The patient was advised to call back or seek an in-person evaluation if the symptoms worsen or if the condition fails to improve as anticipated.  I provided 22 minutes of non-face-to-face time during this encounter.   Glenford Bayley, NP

## 2023-06-08 NOTE — Patient Instructions (Addendum)
Ask about enrolling in [facility]  Bring CPAP by [employer] for copliance download and ask that they fax   Follow-up: 1 year with Waynetta Sandy NP

## 2023-06-10 DIAGNOSIS — Z8679 Personal history of other diseases of the circulatory system: Secondary | ICD-10-CM | POA: Diagnosis not present

## 2023-06-10 DIAGNOSIS — I10 Essential (primary) hypertension: Secondary | ICD-10-CM | POA: Diagnosis not present

## 2023-06-10 DIAGNOSIS — Z1331 Encounter for screening for depression: Secondary | ICD-10-CM | POA: Diagnosis not present

## 2023-06-10 DIAGNOSIS — E119 Type 2 diabetes mellitus without complications: Secondary | ICD-10-CM | POA: Diagnosis not present

## 2023-06-10 DIAGNOSIS — R55 Syncope and collapse: Secondary | ICD-10-CM | POA: Diagnosis not present

## 2023-06-10 DIAGNOSIS — Z125 Encounter for screening for malignant neoplasm of prostate: Secondary | ICD-10-CM | POA: Diagnosis not present

## 2023-06-10 DIAGNOSIS — E782 Mixed hyperlipidemia: Secondary | ICD-10-CM | POA: Diagnosis not present

## 2023-06-10 DIAGNOSIS — K760 Fatty (change of) liver, not elsewhere classified: Secondary | ICD-10-CM | POA: Diagnosis not present

## 2023-06-10 DIAGNOSIS — Z0000 Encounter for general adult medical examination without abnormal findings: Secondary | ICD-10-CM | POA: Diagnosis not present

## 2023-09-06 DIAGNOSIS — K625 Hemorrhage of anus and rectum: Secondary | ICD-10-CM | POA: Diagnosis not present

## 2023-09-06 DIAGNOSIS — R194 Change in bowel habit: Secondary | ICD-10-CM | POA: Diagnosis not present

## 2023-11-11 ENCOUNTER — Encounter: Payer: Self-pay | Admitting: *Deleted

## 2023-11-18 ENCOUNTER — Encounter: Payer: Self-pay | Admitting: Emergency Medicine

## 2023-11-19 ENCOUNTER — Ambulatory Visit
Admission: RE | Admit: 2023-11-19 | Discharge: 2023-11-19 | Disposition: A | Discharge status: Home / Self Care | Payer: BC Managed Care – PPO | Attending: Gastroenterology | Admitting: Gastroenterology

## 2023-11-19 ENCOUNTER — Ambulatory Visit: Admitting: Anesthesiology

## 2023-11-19 ENCOUNTER — Encounter: Payer: Self-pay | Admitting: *Deleted

## 2023-11-19 ENCOUNTER — Encounter
Admission: RE | Disposition: A | Discharge status: Home / Self Care | Payer: Self-pay | Source: Home / Self Care | Attending: Gastroenterology

## 2023-11-19 DIAGNOSIS — G4733 Obstructive sleep apnea (adult) (pediatric): Secondary | ICD-10-CM | POA: Diagnosis not present

## 2023-11-19 DIAGNOSIS — Z6836 Body mass index (BMI) 36.0-36.9, adult: Secondary | ICD-10-CM | POA: Diagnosis not present

## 2023-11-19 DIAGNOSIS — R194 Change in bowel habit: Secondary | ICD-10-CM | POA: Insufficient documentation

## 2023-11-19 DIAGNOSIS — K625 Hemorrhage of anus and rectum: Secondary | ICD-10-CM | POA: Diagnosis not present

## 2023-11-19 DIAGNOSIS — I10 Essential (primary) hypertension: Secondary | ICD-10-CM | POA: Diagnosis not present

## 2023-11-19 DIAGNOSIS — K921 Melena: Secondary | ICD-10-CM | POA: Insufficient documentation

## 2023-11-19 DIAGNOSIS — K641 Second degree hemorrhoids: Secondary | ICD-10-CM | POA: Diagnosis not present

## 2023-11-19 DIAGNOSIS — K640 First degree hemorrhoids: Secondary | ICD-10-CM | POA: Insufficient documentation

## 2023-11-19 DIAGNOSIS — R7303 Prediabetes: Secondary | ICD-10-CM | POA: Insufficient documentation

## 2023-11-19 DIAGNOSIS — R195 Other fecal abnormalities: Secondary | ICD-10-CM | POA: Diagnosis not present

## 2023-11-19 DIAGNOSIS — I471 Supraventricular tachycardia, unspecified: Secondary | ICD-10-CM | POA: Diagnosis not present

## 2023-11-19 DIAGNOSIS — K649 Unspecified hemorrhoids: Secondary | ICD-10-CM | POA: Diagnosis not present

## 2023-11-19 HISTORY — DX: Syncope and collapse: R55

## 2023-11-19 HISTORY — DX: Prediabetes: R73.03

## 2023-11-19 HISTORY — DX: Personal history of other diseases of the circulatory system: Z86.79

## 2023-11-19 HISTORY — DX: Headache, unspecified: R51.9

## 2023-11-19 HISTORY — PX: COLONOSCOPY WITH PROPOFOL: SHX5780

## 2023-11-19 HISTORY — DX: Fatty (change of) liver, not elsewhere classified: K76.0

## 2023-11-19 LAB — GLUCOSE, CAPILLARY: Glucose-Capillary: 103 mg/dL — ABNORMAL HIGH (ref 70–99)

## 2023-11-19 SURGERY — COLONOSCOPY WITH PROPOFOL
Anesthesia: General

## 2023-11-19 MED ORDER — LIDOCAINE HCL (CARDIAC) PF 100 MG/5ML IV SOSY
PREFILLED_SYRINGE | INTRAVENOUS | Status: DC | PRN
  Administered 2023-11-19: 60 mg via INTRAVENOUS

## 2023-11-19 MED ORDER — LIDOCAINE HCL (PF) 2 % IJ SOLN
INTRAMUSCULAR | Status: AC
  Filled 2023-11-19: qty 5

## 2023-11-19 MED ORDER — PROPOFOL 10 MG/ML IV BOLUS
INTRAVENOUS | Status: DC | PRN
  Administered 2023-11-19: 80 mg via INTRAVENOUS
  Administered 2023-11-19 (×3): 20 mg via INTRAVENOUS

## 2023-11-19 MED ORDER — SODIUM CHLORIDE 0.9 % IV SOLN: INTRAVENOUS | Status: DC

## 2023-11-19 MED ORDER — PROPOFOL 500 MG/50ML IV EMUL
INTRAVENOUS | Status: DC | PRN
  Administered 2023-11-19: 120 ug/kg/min via INTRAVENOUS

## 2023-11-19 NOTE — Transfer of Care (Signed)
 Immediate Anesthesia Transfer of Care Note  Patient: Maxwell Davis  Procedure(s) Performed: COLONOSCOPY WITH PROPOFOL  Patient Location: PACU and Endoscopy Unit  Anesthesia Type:General  Level of Consciousness: awake  Airway & Oxygen Therapy: Patient Spontanous Breathing  Post-op Assessment: Report given to RN and Post -op Vital signs reviewed and stable  Post vital signs: Reviewed and stable  Last Vitals:  Vitals Value Taken Time  BP 108/54 11/19/23 1018  Temp    Pulse 60 11/19/23 1018  Resp    SpO2 98 % 11/19/23 1018    Last Pain:  Vitals:   11/19/23 1017  TempSrc: Tympanic  PainSc: 0-No pain         Complications: No notable events documented.

## 2023-11-19 NOTE — Op Note (Signed)
 [facility] Patient Name: Maxwell Davis Procedure Date: 11/19/2023 9:41 AM MRN: [id number] Account #: [id number] Date of Birth: June 10, 1966 Admit Type: Outpatient Age: 58 Room: [facility] ENDO ROOM 3 Gender: Male Note Status: Finalized Instrument Name: Prentice Docker [id number] Procedure:             Colonoscopy Indications:           Hematochezia, Change in bowel habits Providers:             Eather Colas MD, MD Medicines:             Monitored Anesthesia Care Complications:         No immediate complications. Procedure:             Pre-Anesthesia Assessment:                        - Prior to the procedure, a History and Physical was                         performed, and patient medications and allergies were                         reviewed. The patient is competent. The risks and                         benefits of the procedure and the sedation options and                         risks were discussed with the patient. All questions                         were answered and informed consent was obtained.                         Patient identification and proposed procedure were                         verified by the physician, the nurse, the                         anesthesiologist, the anesthetist and the technician                         in the endoscopy suite. Mental Status Examination:                         alert and oriented. Airway Examination: normal                         oropharyngeal airway and neck mobility. Respiratory                         Examination: clear to auscultation. CV Examination:                         normal. Prophylactic Antibiotics: The patient does not                         require prophylactic antibiotics. Prior  Anticoagulants: The patient has taken no anticoagulant                         or antiplatelet agents. ASA Grade Assessment: III - A                         patient with severe  systemic disease. After reviewing                         the risks and benefits, the patient was deemed in                         satisfactory condition to undergo the procedure. The                         anesthesia plan was to use monitored anesthesia care                         (MAC). Immediately prior to administration of                         medications, the patient was re-assessed for adequacy                         to receive sedatives. The heart rate, respiratory                         rate, oxygen saturations, blood pressure, adequacy of                         pulmonary ventilation, and response to care were                         monitored throughout the procedure. The physical                         status of the patient was re-assessed after the                         procedure.                        After obtaining informed consent, the colonoscope was                         passed under direct vision. Throughout the procedure,                         the patient's blood pressure, pulse, and oxygen                         saturations were monitored continuously. The                         Colonoscope was introduced through the anus and                         advanced to the the terminal ileum, with  identification of the appendiceal orifice and IC                         valve. The colonoscopy was performed without                         difficulty. The patient tolerated the procedure well.                         The quality of the bowel preparation was good. The                         terminal ileum, ileocecal valve, appendiceal orifice,                         and rectum were photographed. Findings:      The perianal and digital rectal examinations were normal.      The terminal ileum appeared normal.      Internal hemorrhoids were found during retroflexion. The hemorrhoids       were Grade I (internal hemorrhoids that do not  prolapse).      The exam was otherwise without abnormality on direct and retroflexion       views. Impression:            - The examined portion of the ileum was normal.                        - Internal hemorrhoids.                        - The examination was otherwise normal on direct and                         retroflexion views.                        - No specimens collected. Recommendation:        - Discharge patient to home.                        - Resume previous diet.                        - Continue present medications.                        - Repeat colonoscopy in 10 years for screening                         purposes.                        - Return to referring physician as previously                         scheduled. Procedure Code(s):     --- Professional ---                        [phone], Colonoscopy, flexible; diagnostic, including  collection of specimen(s) by brushing or washing, when                         performed (separate procedure) Diagnosis Code(s):     --- Professional ---                        K64.0, First degree hemorrhoids                        K92.1, Melena (includes Hematochezia)                        R19.4, Change in bowel habit CPT copyright 2022 American [employer] Association. All rights reserved. The codes documented in this report are preliminary and upon coder review may  be revised to meet current compliance requirements. Eather Colas MD, MD 11/19/2023 10:19:49 AM Number of Addenda: 0 Note Initiated On: 11/19/2023 9:41 AM Scope Withdrawal Time: 0 hours 9 minutes 10 seconds  Total Procedure Duration: 0 hours 14 minutes 11 seconds  Estimated Blood Loss:  Estimated blood loss: none.      [facility] [place]

## 2023-11-19 NOTE — H&P (Signed)
 Outpatient short stay form Pre-procedure 11/19/2023  Regis Bill, MD  Primary Physician: [facility]  Reason for visit:  Change in bowel habits  History of present illness:    58 y/o gentleman with history of hypertension and OSA here for colonoscopy due to small amount of hematochezia and change in bowel habits. No blood thinners. No family history of GI malignancies. No significant abdominal surgeries.    Current Facility-Administered Medications:    0.9 %  sodium chloride infusion, , Intravenous, Continuous, Efren Kross, Rossie Muskrat, MD, Last Rate: 20 mL/hr at 11/19/23 0932, New Bag at 11/19/23 0932  Medications Prior to Admission  Medication Sig Dispense Refill Last Dose/Taking   aspirin EC (ASPIRIN 81) 81 MG tablet Take 81 mg by mouth daily.   Past Week   lisinopril-hydrochlorothiazide (ZESTORETIC) 10-12.5 MG tablet Take 1 tablet by mouth once daily 90 tablet 2 Past Week   loratadine (CLARITIN) 10 MG tablet Take 10 mg by mouth daily as needed for allergies.   Past Week   metformin (FORTAMET) 500 MG (OSM) 24 hr tablet Take 500 mg by mouth daily with breakfast.   Past Week     Allergies  Allergen Reactions   Levetiracetam Other (See Comments)    Irritable, mood swings   Penicillins Rash    Has patient had a PCN reaction causing immediate rash, facial/tongue/throat swelling, SOB or lightheadedness with hypotension: Unknown Has patient had a PCN reaction causing severe rash involving mucus membranes or skin necrosis: Unknown Has patient had a PCN reaction that required hospitalization: No Has patient had a PCN reaction occurring within the last 10 years: No If all of the above answers are "NO", then may proceed with Cephalosporin use.      Past Medical History:  Diagnosis Date   Essential hypertension    Headache    Hepatic steatosis    History of PSVT (paroxysmal supraventricular tachycardia)    Hyperlipidemia    Morbid obesity (HCC)    OSA (obstructive  sleep apnea)    Pre-diabetes    Seizures (HCC)    a. On dilantin.  Last seizure 1995.   Vasovagal syncope     Review of systems:  Otherwise negative.    Physical Exam  Gen: Alert, oriented. Appears stated age.  HEENT: PERRLA. Lungs: No respiratory distress CV: RRR Abd: soft, benign, no masses Ext: No edema    Planned procedures: Proceed with colonoscopy. The patient understands the nature of the planned procedure, indications, risks, alternatives and potential complications including but not limited to bleeding, infection, perforation, damage to internal organs and possible oversedation/side effects from anesthesia. The patient agrees and gives consent to proceed.  Please refer to procedure notes for findings, recommendations and patient disposition/instructions.     Regis Bill, MD [facility]

## 2023-11-19 NOTE — Anesthesia Preprocedure Evaluation (Addendum)
 Anesthesia Evaluation  Patient identified by MRN, date of birth, ID band Patient awake    Reviewed: Allergy & Precautions, NPO status , Patient's Chart, lab work & pertinent test results  History of Anesthesia Complications Negative for: history of anesthetic complications  Airway Mallampati: III   Neck ROM: Full    Dental no notable dental hx.    Pulmonary sleep apnea and Continuous Positive Airway Pressure Ventilation    Pulmonary exam normal breath sounds clear to auscultation       Cardiovascular hypertension, Normal cardiovascular exam+ dysrhythmias Supra Ventricular Tachycardia  Rhythm:Regular Rate:Normal  ECG 02/09/23: normal   Neuro/Psych  Headaches, Seizures - (last sz in 1995),     GI/Hepatic negative GI ROS,,,  Endo/Other  Obesity; prediabetes  Renal/GU negative Renal ROS     Musculoskeletal   Abdominal   Peds  Hematology negative hematology ROS (+)   Anesthesia Other Findings Cardiology note 02/09/23:  ASSESSMENT AND PLAN:   #) NSVT No further episodes No palpitations   #) vasovagal syncope Continues to have rare episodes, about 1/year.  Always associated with defecation. Do not believe syncopal episodes triggered by arrhythmia at this time   #) HTN Well-controlled in office today and on home measurements. Continue dietary adjustments Increase physical activity as able   #) OSA Encourage nightly use   Reproductive/Obstetrics                             Anesthesia Physical Anesthesia Plan  ASA: 3  Anesthesia Plan: General   Post-op Pain Management:    Induction: Intravenous  PONV Risk Score and Plan: 2 and Propofol infusion, TIVA and Treatment may vary due to age or medical condition  Airway Management Planned: Natural Airway  Additional Equipment:   Intra-op Plan:   Post-operative Plan:   Informed Consent: I have reviewed the patients History and  Physical, chart, labs and discussed the procedure including the risks, benefits and alternatives for the proposed anesthesia with the patient or authorized representative who has indicated his/her understanding and acceptance.       Plan Discussed with: CRNA  Anesthesia Plan Comments: (LMA/GETA backup discussed.  Patient consented for risks of anesthesia including but not limited to:  - adverse reactions to medications - damage to eyes, teeth, lips or other oral mucosa - nerve damage due to positioning  - sore throat or hoarseness - damage to heart, brain, nerves, lungs, other parts of body or loss of life  Informed patient about role of CRNA in peri- and intra-operative care.  Patient voiced understanding.)        Anesthesia Quick Evaluation

## 2023-11-19 NOTE — Interval H&P Note (Signed)
 History and Physical Interval Note:  11/19/2023 9:52 AM  Maxwell Davis  has presented today for surgery, with the diagnosis of Bowel habit changes,BRBPR rectum.  The various methods of treatment have been discussed with the patient and family. After consideration of risks, benefits and other options for treatment, the patient has consented to  Procedure(s): COLONOSCOPY WITH PROPOFOL (N/A) as a surgical intervention.  The patient's history has been reviewed, patient examined, no change in status, stable for surgery.  I have reviewed the patient's chart and labs.  Questions were answered to the patient's satisfaction.     Regis Bill  Ok to proceed with colonoscopy

## 2023-11-19 NOTE — Anesthesia Postprocedure Evaluation (Signed)
 Anesthesia Post Note  Patient: Maxwell Davis  Procedure(s) Performed: COLONOSCOPY WITH PROPOFOL  Patient location during evaluation: PACU Anesthesia Type: General Level of consciousness: awake and alert, oriented and patient cooperative Pain management: pain level controlled Vital Signs Assessment: post-procedure vital signs reviewed and stable Respiratory status: spontaneous breathing, nonlabored ventilation and respiratory function stable Cardiovascular status: blood pressure returned to baseline and stable Postop Assessment: adequate PO intake Anesthetic complications: no   No notable events documented.   Last Vitals:  Vitals:   11/19/23 1027 11/19/23 1037  BP: 108/81 124/83  Pulse: 68 65  Resp: 20 19  Temp:    SpO2: 99% 98%    Last Pain:  Vitals:   11/19/23 1037  TempSrc:   PainSc: 0-No pain                 Reed Breech

## 2023-11-22 ENCOUNTER — Encounter: Payer: Self-pay | Admitting: Gastroenterology

## 2024-01-26 DIAGNOSIS — R7303 Prediabetes: Secondary | ICD-10-CM | POA: Diagnosis not present

## 2024-01-26 DIAGNOSIS — H2513 Age-related nuclear cataract, bilateral: Secondary | ICD-10-CM | POA: Diagnosis not present

## 2024-01-26 DIAGNOSIS — H35033 Hypertensive retinopathy, bilateral: Secondary | ICD-10-CM | POA: Diagnosis not present

## 2024-01-26 DIAGNOSIS — I10 Essential (primary) hypertension: Secondary | ICD-10-CM | POA: Diagnosis not present

## 2024-03-02 ENCOUNTER — Other Ambulatory Visit: Payer: Self-pay | Admitting: Internal Medicine

## 2024-03-28 ENCOUNTER — Other Ambulatory Visit: Payer: Self-pay | Admitting: Internal Medicine

## 2024-03-29 ENCOUNTER — Other Ambulatory Visit: Payer: Self-pay

## 2024-03-29 MED ORDER — LISINOPRIL-HYDROCHLOROTHIAZIDE 10-12.5 MG PO TABS: 1.0000 | ORAL_TABLET | Freq: Every day | ORAL | 0 refills | Status: DC

## 2024-04-05 NOTE — Progress Notes (Unsigned)
 Electrophysiology Clinic Note    Date:  04/06/2024  Patient ID:  Maxwell Davis, DOB 08-16-1965, MRN [id number] PCP:  [facility]  Cardiologist:  Evalene Lunger, MD   Electrophysiologist:  Elspeth Sage, MD     Discussed the use of AI scribe software for clinical note transcription with the patient, who gave verbal consent to proceed.   Patient Profile    Chief Complaint: 1 year follow-up  History of Present Illness: Maxwell Davis is a 58 y.o. male with PMH notable for NSVT, vasovagal syncope, HTN, OSA on CPAP ; seen today for Elspeth Sage, MD for routine electrophysiology followup.   I last saw him 02/2023. He had 1 additional episode of syncope with defecation, history of such with syncopal episodes ~1 / year.   On follow-up today, he is doing very well. He has not had any syncopal episodes since our last appointment. He denies chest pain, chest pressure, palpitations. He is active walking his dog daily ~1 mile. He denies edema. Continues to try to lose weight.     Arrhythmia/Device History No specialty comments available.    ROS:  Please see the history of present illness. All other systems are reviewed and otherwise negative.    Physical Exam    VS:  BP 124/82 (BP Location: Left Arm, Patient Position: Sitting, Cuff Size: Normal)   Pulse 68   Ht 5' 10 (1.778 m)   Wt 258 lb (117 kg)   SpO2 98%   BMI 37.02 kg/m  BMI: Body mass index is 37.02 kg/m.      Wt Readings from Last 3 Encounters:  04/06/24 258 lb (117 kg)  11/19/23 252 lb (114.3 kg)  02/09/23 265 lb 6.4 oz (120.4 kg)     GEN- The patient is well appearing, alert and oriented x 3 today.   Lungs- Clear to ausculation bilaterally, normal work of breathing.  Heart- Regular rate and rhythm, no murmurs, rubs or gallops Extremities- No peripheral edema, warm, dry    Studies Reviewed   Previous EP, cardiology notes.    EKG is ordered. Personal review of EKG from today shows:    EKG  Interpretation Date/Time:  Thursday April 06 2024 09:27:51 EDT Ventricular Rate:  68 PR Interval:  150 QRS Duration:  102 QT Interval:  416 QTC Calculation: 442 R Axis:   -3  Text Interpretation: Normal sinus rhythm Normal ECG Confirmed by Trinh Sanjose [phone]) on 04/06/2024 9:32:13 AM    Long term monitor, 10/18/2017 Normal sinus rhythm 9 beat run of nonsustained VT, Heart rate 185 bpm, patient activated Episodes of sinus tachycardia No other significant arrhythmia noted   TTE, 08/26/2017 - Left ventricle: The cavity size was normal. Systolic function was normal. The estimated ejection fraction was in the range of 60% to 65%. Wall motion was normal; there were no regional wall motion abnormalities. Left ventricular diastolic function parameters were normal.  - Left atrium: The atrium was normal in size.  - Right ventricle: Systolic function was normal.  - Pulmonary arteries: Systolic pressure was within the normal range.    Assessment and Plan    #) NSVT No further episodes No palpitations   #) vasovagal syncope No recent episodes, in past episodes always associated with defecation.   #) HTN Well-controlled in office today and on home measurements. Continue dietary adjustments Increase physical activity as able        Current medicines are reviewed at length with the patient today.  The patient does not have concerns regarding his medicines.  The following changes were made today:  none  Labs/ tests ordered today include:  Orders Placed This Encounter  Procedures   EKG 12-Lead     Disposition: Follow up with EP Team  PRN  We discussed that it would be reasonable to follow with PCP if they are comfortable with it. Alteratively, recommend he follow-up with general cardiology (previously Dr. Gollan) in 1 year   Signed, Chantal Needle, NP  04/06/24  10:00 AM  Electrophysiology [facility]

## 2024-04-06 ENCOUNTER — Ambulatory Visit: Attending: Cardiology | Admitting: Cardiology

## 2024-04-06 VITALS — BP 124/82 | HR 68 | Ht 70.0 in | Wt 258.0 lb

## 2024-04-06 DIAGNOSIS — I4729 Other ventricular tachycardia: Secondary | ICD-10-CM | POA: Diagnosis not present

## 2024-04-06 DIAGNOSIS — I10 Essential (primary) hypertension: Secondary | ICD-10-CM

## 2024-04-06 DIAGNOSIS — R55 Syncope and collapse: Secondary | ICD-10-CM

## 2024-04-06 DIAGNOSIS — G4733 Obstructive sleep apnea (adult) (pediatric): Secondary | ICD-10-CM

## 2024-04-06 MED ORDER — LISINOPRIL-HYDROCHLOROTHIAZIDE 10-12.5 MG PO TABS: 1.0000 | ORAL_TABLET | Freq: Every day | ORAL | 3 refills | Status: AC

## 2024-04-06 NOTE — Patient Instructions (Signed)
 Medication Instructions:   Your physician recommends that you continue on your current medications as directed. Please refer to the Current Medication list given to you today.   *If you need a refill on your cardiac medications before your next appointment, please call your pharmacy*  Lab Work: No labs ordered today  If you have labs (blood work) drawn today and your tests are completely normal, you will receive your results only by: MyChart Message (if you have MyChart) OR A paper copy in the mail If you have any lab test that is abnormal or we need to change your treatment, we will call you to review the results.  Testing/Procedures: No test ordered today   Follow-Up: At [facility], you and your health needs are our priority.  As part of our continuing mission to provide you with exceptional heart care, our providers are all part of one team.  This team includes your primary Cardiologist (physician) and Advanced Practice Providers or APPs (Physician Assistants and Nurse Practitioners) who all work together to provide you with the care you need, when you need it.  Your next appointment:   1 year(s)  Provider:   You may see Timothy Gollan, MD  or one of the following Advanced Practice Providers on your designated Care Team:   Laneta Pintos, NP Gildardo Labrador, PA-C Varney Gentleman, PA-C Cadence Alta Sierra, PA-C Ronald Cockayne, NP Morey Ar, NP    We recommend signing up for the patient portal called MyChart.  Sign up information is provided on this After Visit Summary.  MyChart is used to connect with patients for Virtual Visits (Telemedicine).  Patients are able to view lab/test results, encounter notes, upcoming appointments, etc.  Non-urgent messages can be sent to your provider as well.   To learn more about what you can do with MyChart, go to [email or web address].

## 2024-06-12 DIAGNOSIS — K760 Fatty (change of) liver, not elsewhere classified: Secondary | ICD-10-CM | POA: Diagnosis not present

## 2024-06-12 DIAGNOSIS — R519 Headache, unspecified: Secondary | ICD-10-CM | POA: Diagnosis not present

## 2024-06-12 DIAGNOSIS — Z1331 Encounter for screening for depression: Secondary | ICD-10-CM | POA: Diagnosis not present

## 2024-06-12 DIAGNOSIS — E669 Obesity, unspecified: Secondary | ICD-10-CM | POA: Diagnosis not present

## 2024-06-12 DIAGNOSIS — Z79899 Other long term (current) drug therapy: Secondary | ICD-10-CM | POA: Diagnosis not present

## 2024-06-12 DIAGNOSIS — E782 Mixed hyperlipidemia: Secondary | ICD-10-CM | POA: Diagnosis not present

## 2024-06-12 DIAGNOSIS — Z125 Encounter for screening for malignant neoplasm of prostate: Secondary | ICD-10-CM | POA: Diagnosis not present

## 2024-06-12 DIAGNOSIS — E119 Type 2 diabetes mellitus without complications: Secondary | ICD-10-CM | POA: Diagnosis not present

## 2024-08-19 ENCOUNTER — Emergency Department: Admission: EM | Admit: 2024-08-19 | Discharge: 2024-08-19 | Disposition: A | Discharge status: Home / Self Care

## 2024-08-19 ENCOUNTER — Emergency Department

## 2024-08-19 ENCOUNTER — Other Ambulatory Visit: Payer: Self-pay

## 2024-08-19 DIAGNOSIS — I10 Essential (primary) hypertension: Secondary | ICD-10-CM | POA: Insufficient documentation

## 2024-08-19 DIAGNOSIS — E119 Type 2 diabetes mellitus without complications: Secondary | ICD-10-CM | POA: Insufficient documentation

## 2024-08-19 DIAGNOSIS — R319 Hematuria, unspecified: Secondary | ICD-10-CM

## 2024-08-19 DIAGNOSIS — N2889 Other specified disorders of kidney and ureter: Secondary | ICD-10-CM | POA: Insufficient documentation

## 2024-08-19 HISTORY — DX: Calculus of kidney: N20.0

## 2024-08-19 LAB — BASIC METABOLIC PANEL WITH GFR
Anion gap: 12 (ref 5–15)
BUN: 16 mg/dL (ref 6–20)
CO2: 24 mmol/L (ref 22–32)
Calcium: 9.7 mg/dL (ref 8.9–10.3)
Chloride: 103 mmol/L (ref 98–111)
Creatinine, Ser: 0.94 mg/dL (ref 0.61–1.24)
GFR, Estimated: >60 mL/min
Glucose, Bld: 197 mg/dL — ABNORMAL HIGH (ref 70–99)
Potassium: 4.2 mmol/L (ref 3.5–5.1)
Sodium: 139 mmol/L (ref 135–145)

## 2024-08-19 LAB — CBC
HCT: 41.9 % (ref 39.0–52.0)
Hemoglobin: 14.4 g/dL (ref 13.0–17.0)
MCH: 29.8 pg (ref 26.0–34.0)
MCHC: 34.4 g/dL (ref 30.0–36.0)
MCV: 86.6 fL (ref 80.0–100.0)
Platelets: 273 K/uL (ref 150–400)
RBC: 4.84 MIL/uL (ref 4.22–5.81)
RDW: 11.9 % (ref 11.5–15.5)
WBC: 7.6 K/uL (ref 4.0–10.5)
nRBC: 0 % (ref 0.0–0.2)

## 2024-08-19 LAB — URINALYSIS, ROUTINE W REFLEX MICROSCOPIC
Bacteria, UA: NONE SEEN
Bilirubin Urine: NEGATIVE
Glucose, UA: NEGATIVE mg/dL
Ketones, ur: NEGATIVE mg/dL
Leukocytes,Ua: NEGATIVE
Nitrite: NEGATIVE
Protein, ur: NEGATIVE mg/dL
RBC / HPF: >50 RBC/hpf (ref 0–5)
Specific Gravity, Urine: 1.021 (ref 1.005–1.030)
pH: 5 (ref 5.0–8.0)

## 2024-08-19 NOTE — Discharge Instructions (Signed)
 You were seen today due to concern of blood in your urine.  As we have discussed I am concerned that you have a mass on your right kidney which could be from a cancer.  You need to make sure you follow-up with the oncologist and the urologist, the oncology office should be calling you on Monday to help arrange for an appointment.  You need to call the urologist to arrange for a follow-up appointment.  If you have any worsening of symptoms such as severe pain, difficulty urinating, or any other symptoms you find concerning please return to the emergency department immediately for further medical management.  We have placed a Foley catheter, please take care of this as discussed by our nursing staff.

## 2024-08-19 NOTE — ED Triage Notes (Signed)
 Pt to ED for painless hematuria [ink to dark red since 4 days. No dysuria. May have passed small clot last night. States may have a kidney stone that was diagnosed in the past.

## 2024-08-19 NOTE — ED Provider Notes (Signed)
 "  [facility] Provider Note    Event Date/Time   First MD Initiated Contact with Patient 08/19/24 0809     (approximate)   History   Hematuria   HPI  Maxwell Davis is a 59 y.o. male with history of diabetes high blood pressure presenting today with concern of painless hematuria.  Ongoing for about 3 to 4 days, last night states that he felt like he passed a clot of blood.  He states he has not been having difficulty voiding, and is having no abdominal pain back pain or other symptoms.  Denies nausea vomiting fevers chills.  Apparently his wife is currently in the ICU that resulted in a lot of stress for him.  He states that he takes a baby aspirin  but no other blood thinning medications.  No history of easy bleeding in the past.  Is not a smoker.  No other complaints at this time.     Physical Exam   Triage Vital Signs: ED Triage Vitals  Encounter Vitals Group     BP 08/19/24 0709 (!) 156/93     Girls Systolic BP Percentile --      Girls Diastolic BP Percentile --      Boys Systolic BP Percentile --      Boys Diastolic BP Percentile --      Pulse Rate 08/19/24 0709 87     Resp 08/19/24 0709 16     Temp 08/19/24 0709 97.7 F (36.5 C)     Temp Source 08/19/24 0709 Oral     SpO2 08/19/24 0709 97 %     Weight 08/19/24 0708 255 lb (115.7 kg)     Height 08/19/24 0708 5' 10 (1.778 m)     Head Circumference --      Peak Flow --      Pain Score 08/19/24 0708 0     Pain Loc --      Pain Education --      Exclude from Growth Chart --     Most recent vital signs: Vitals:   08/19/24 0709  BP: (!) 156/93  Pulse: 87  Resp: 16  Temp: 97.7 F (36.5 C)  SpO2: 97%     General: Awake, no distress.  CV:  Good peripheral perfusion.  Resp:  Normal effort.  Abd:  No distention.  Soft nontender Other:     ED Results / Procedures / Treatments   Labs (all labs ordered are listed, but only abnormal results are displayed) Labs Reviewed  URINALYSIS,  ROUTINE W REFLEX MICROSCOPIC - Abnormal; Notable for the following components:      Result Value   Color, Urine YELLOW (*)    APPearance HAZY (*)    Hgb urine dipstick LARGE (*)    All other components within normal limits  BASIC METABOLIC PANEL WITH GFR - Abnormal; Notable for the following components:   Glucose, Bld 197 (*)    All other components within normal limits  CBC     EKG     RADIOLOGY   PROCEDURES:  Critical Care performed: No  Procedures   MEDICATIONS ORDERED IN ED: Medications - No data to display   IMPRESSION / MDM / ASSESSMENT AND PLAN / ED COURSE  I reviewed the triage vital signs and the nursing notes.                               Patient's presentation  is most consistent with acute complicated illness / injury requiring diagnostic workup.  59 year old male presenting today with concern of painless hematuria.  He appears well he is not in any acute distress his vitals are reassuring aside from slight high blood pressure which may be secondary to his known hypertension.  His abdominal exam is fairly reassuring.  Given the presentation at this time concerning possible Foley catheter placement for anticipated course with urology follow-up.  Given that he does not have any history of this before in the past and his symptoms will obtain CT imaging without contrast to further assess for possible tumor or other etiology.  If this is reassuring, plan for discharge home with outpatient urology follow-up.  He will bladder scan and discuss risk benefits of Foley catheter placement.   Clinical Course as of 08/19/24 9078  Sat Aug 19, 2024  9143 CT imaging did concerning concerning for intrarenal mass likely explaining his symptomatology of painless hematuria. [SK]  [id number] Spoke with Dr. Babara from oncology regarding patient's presentation and finding.  Will plan to follow-up with the patient in her clinic.  Feels reasonable to hold off for outpatient MRI and also  recommending urology follow-up.  I discussed findings with the patient and importance of follow-up.  We will give him a Foley catheter as he does have about 200 cc in his bladder now.  I discussed return precautions we will have him discharged home at this time. [SK]    Clinical Course User Index [SK] Fernand Rossie HERO, MD     FINAL CLINICAL IMPRESSION(S) / ED DIAGNOSES   Final diagnoses:  Renal mass  Hematuria, unspecified type     Rx / DC Orders   ED Discharge Orders     None        Note:  This document was prepared using Dragon voice recognition software and may include unintentional dictation errors.   Fernand Rossie HERO, MD 08/19/24 [id number]  "

## 2024-08-20 ENCOUNTER — Other Ambulatory Visit: Payer: Self-pay

## 2024-08-20 ENCOUNTER — Emergency Department: Admission: EM | Admit: 2024-08-20 | Discharge: 2024-08-20 | Disposition: A | Discharge status: Home / Self Care

## 2024-08-20 ENCOUNTER — Emergency Department

## 2024-08-20 DIAGNOSIS — I10 Essential (primary) hypertension: Secondary | ICD-10-CM | POA: Diagnosis not present

## 2024-08-20 DIAGNOSIS — D72829 Elevated white blood cell count, unspecified: Secondary | ICD-10-CM | POA: Insufficient documentation

## 2024-08-20 DIAGNOSIS — N2889 Other specified disorders of kidney and ureter: Secondary | ICD-10-CM | POA: Insufficient documentation

## 2024-08-20 DIAGNOSIS — K5900 Constipation, unspecified: Secondary | ICD-10-CM | POA: Insufficient documentation

## 2024-08-20 DIAGNOSIS — R109 Unspecified abdominal pain: Secondary | ICD-10-CM | POA: Diagnosis present

## 2024-08-20 DIAGNOSIS — R10A1 Flank pain, right side: Secondary | ICD-10-CM

## 2024-08-20 LAB — CBC
HCT: 44.6 % (ref 39.0–52.0)
Hemoglobin: 15.3 g/dL (ref 13.0–17.0)
MCH: 29.9 pg (ref 26.0–34.0)
MCHC: 34.3 g/dL (ref 30.0–36.0)
MCV: 87.1 fL (ref 80.0–100.0)
Platelets: 325 K/uL (ref 150–400)
RBC: 5.12 MIL/uL (ref 4.22–5.81)
RDW: 11.9 % (ref 11.5–15.5)
WBC: 11.1 K/uL — ABNORMAL HIGH (ref 4.0–10.5)
nRBC: 0 % (ref 0.0–0.2)

## 2024-08-20 LAB — COMPREHENSIVE METABOLIC PANEL WITH GFR
ALT: 47 U/L — ABNORMAL HIGH (ref 0–44)
AST: 31 U/L (ref 15–41)
Albumin: 4.7 g/dL (ref 3.5–5.0)
Alkaline Phosphatase: 73 U/L (ref 38–126)
Anion gap: 12 (ref 5–15)
BUN: 18 mg/dL (ref 6–20)
CO2: 25 mmol/L (ref 22–32)
Calcium: 10.2 mg/dL (ref 8.9–10.3)
Chloride: 100 mmol/L (ref 98–111)
Creatinine, Ser: 0.97 mg/dL (ref 0.61–1.24)
GFR, Estimated: >60 mL/min
Glucose, Bld: 117 mg/dL — ABNORMAL HIGH (ref 70–99)
Potassium: 3.9 mmol/L (ref 3.5–5.1)
Sodium: 137 mmol/L (ref 135–145)
Total Bilirubin: 0.5 mg/dL (ref 0.0–1.2)
Total Protein: 8 g/dL (ref 6.5–8.1)

## 2024-08-20 LAB — LACTIC ACID, PLASMA: Lactic Acid, Venous: 1.9 mmol/L (ref 0.5–1.9)

## 2024-08-20 LAB — LIPASE, BLOOD: Lipase: 23 U/L (ref 11–51)

## 2024-08-20 MED ORDER — POLYETHYLENE GLYCOL 3350 17 G PO PACK: 17.0000 g | PACK | Freq: Every day | ORAL | 0 refills | Status: AC

## 2024-08-20 MED ORDER — MORPHINE SULFATE (PF) 2 MG/ML IV SOLN
2.0000 mg | Freq: Once | INTRAVENOUS | Status: AC
  Administered 2024-08-20: 2 mg via INTRAVENOUS
  Filled 2024-08-20: qty 1

## 2024-08-20 MED ORDER — IOHEXOL 300 MG/ML  SOLN
100.0000 mL | Freq: Once | INTRAMUSCULAR | Status: AC | PRN
  Administered 2024-08-20: 100 mL via INTRAVENOUS

## 2024-08-20 MED ORDER — ACETAMINOPHEN 500 MG PO TABS: 1000.0000 mg | ORAL_TABLET | Freq: Four times a day (QID) | ORAL | 2 refills | Status: AC | PRN

## 2024-08-20 NOTE — ED Provider Notes (Signed)
 "  [facility] Provider Note    Event Date/Time   First MD Initiated Contact with Patient 08/20/24 1627     (approximate)   History   Abdominal Pain  Pt presents to ED from home C/O RLQ abdominal pain starting about an hour ago. Last BM 4 days ago. +passing gas. Denies n/v.  Of note, seen yesterday for hematuria and was told he has a mass on R kidney.   HPI Maxwell Davis is a 59 y.o. male PMH hypertension, hyperlipidemia, seizure disorder, NSVT, hepatic steatosis, kidney stones, elevated BMI presents for evaluation of abdominal pain - Patient states he was visiting his wife around 1 PM in the ICU when he suddenly developed acute onset severe right flank and right sided lateral abdominal pain.  Was 9/10, has spontaneously improved to 3/10.  Has noticed pain has migrated somewhat to his left lower quadrant of his abdomen as well. - No preceding trauma.  Notes he has not had a bowel movement for 4 days and is passing gas.  Is not taking any laxatives. - No urinary symptoms, does have Foley catheter in place, hematuria has resolved (see summary of ED visit yesterday below) - No chest pain or shortness of breath - No midline back pain, no lower extremity weakness, no fever  Per chart review, patient was seen at our emergency department yesterday for hematuria.  Did have some mild urinary retention, Foley placed.  CT abdomen pelvis Noncon showing heterogeneous interpolar right renal mass concerning for possible renal cell carcinoma.  No other acute pathology identified.  Urinalysis with no evidence of infection.      Physical Exam   Triage Vital Signs: ED Triage Vitals  Encounter Vitals Group     BP 08/20/24 1454 (!) 128/93     Girls Systolic BP Percentile --      Girls Diastolic BP Percentile --      Boys Systolic BP Percentile --      Boys Diastolic BP Percentile --      Pulse Rate 08/20/24 1454 85     Resp 08/20/24 1454 14     Temp 08/20/24 1454 98.3 F  (36.8 C)     Temp Source 08/20/24 1454 Oral     SpO2 08/20/24 1454 98 %     Weight --      Height --      Head Circumference --      Peak Flow --      Pain Score 08/20/24 1455 6     Pain Loc --      Pain Education --      Exclude from Growth Chart --     Most recent vital signs: Vitals:   08/20/24 1454  BP: (!) 128/93  Pulse: 85  Resp: 14  Temp: 98.3 F (36.8 C)  SpO2: 98%     General: Awake, no distress.  CV:  Good peripheral perfusion. RRR, RP 2+ Resp:  Normal effort. CTAB Abd:  No distention. +-Moderate tenderness in right lateral abdomen/flank.  Mild tenderness in left lower quadrant.  No midline back pain.  No left CVA tenderness. Other: Foley catheter in place draining clear yellow urine   ED Results / Procedures / Treatments   Labs (all labs ordered are listed, but only abnormal results are displayed) Labs Reviewed  COMPREHENSIVE METABOLIC PANEL WITH GFR - Abnormal; Notable for the following components:      Result Value   Glucose, Bld 117 (*)  ALT 47 (*)    All other components within normal limits  CBC - Abnormal; Notable for the following components:   WBC 11.1 (*)    All other components within normal limits  LIPASE, BLOOD  LACTIC ACID, PLASMA     EKG  N/a   RADIOLOGY Radiology interpreted by myself and radiology report reviewed.  Redemonstration of recently diagnosed renal mass concerning for renal cell carcinoma.  No acute pathology identified.    PROCEDURES:  Critical Care performed: No  Procedures   MEDICATIONS ORDERED IN ED: Medications  morphine  (PF) 2 MG/ML injection 2 mg (2 mg Intravenous Given 08/20/24 1701)  iohexol  (OMNIPAQUE ) 300 MG/ML solution 100 mL (100 mLs Intravenous Contrast Given 08/20/24 1717)     IMPRESSION / MDM / ASSESSMENT AND PLAN / ED COURSE  I reviewed the triage vital signs and the nursing notes.                              DDX/MDM/AP: Differential diagnosis includes, but is not limited to,  atypical appendicitis, cholecystitis, diverticulitis, consider hemorrhage from known mass or vascular complication, doubt interval development of urolithiasis or UTI.  Consider pain secondary to constipation.  Plan: - N.p.o. - Labs - CT abdomen pelvis with contrast - Pain control -  reassess  Patient's presentation is most consistent with acute presentation with potential threat to life or bodily function.  The patient is on the cardiac monitor to evaluate for evidence of arrhythmia and/or significant heart rate changes.  ED course below.  Workup with redemonstration of renal masses concerning for renal cell carcinoma, no complications appreciated.  No other acute findings.  Pain control here in emergency department.  Consider pain secondary to mass, muscle strain, pain secondary constipation.  Rx Tylenol , MiraLAX .  Plan for outpatient oncology and neurology follow-up as already planned as well as PMD follow-up.  ED return precautions in place.  Patient agrees with plan.  Clinical Course as of 08/20/24 1805  Sun Aug 20, 2024  1722 CBC with mild leukocytosis, nonspecific, otherwise unremarkable  CMP reviewed, overall unremarkable, very mild elevation in ALT is nonspecific [MM]  1749 CTAP: IMPRESSION: 1. Two right renal masses consistent with renal cell carcinomas. No findings for tumor thrombus in the right renal vein or retroperitoneal lymphadenopathy. 2. No findings for metastatic disease involving the abdomen/pelvis or bony structures. 3. Diffuse fatty infiltration of the liver. 4. Stable benign mesenteritis or panniculitis.   [MM]    Clinical Course User Index [MM] Clarine Ozell LABOR, MD     FINAL CLINICAL IMPRESSION(S) / ED DIAGNOSES   Final diagnoses:  Right flank pain  Renal mass  Constipation, unspecified constipation type     Rx / DC Orders   ED Discharge Orders          Ordered    polyethylene glycol (MIRALAX ) 17 g packet  Daily        08/20/24 1802     acetaminophen  (TYLENOL ) 500 MG tablet  Every 6 hours PRN        08/20/24 1802             Note:  This document was prepared using Dragon voice recognition software and may include unintentional dictation errors.   Clarine Ozell LABOR, MD 08/20/24 1805  "

## 2024-08-20 NOTE — Discharge Instructions (Addendum)
 Your evaluation in the emergency department is overall reassuring.  We saw no concerning findings today including on the CT scan, but as discussed we do have concern for possible malignancy in your kidney and it is important that you follow-up with oncology and urology as already planned.  You can use Tylenol  as needed for any recurrent discomfort.  I have also prescribed you a laxative to help with your constipation.  Please follow-up with your primary care provider in addition.  Return to the emergency department with any new or worsening symptoms.

## 2024-08-20 NOTE — ED Triage Notes (Signed)
 Pt presents to ED from home C/O RLQ abdominal pain starting about an hour ago. Last BM 4 days ago. +passing gas. Denies n/v.  Of note, seen yesterday for hematuria and was told he has a mass on R kidney.

## 2024-08-21 ENCOUNTER — Inpatient Hospital Stay: Attending: Oncology | Admitting: Oncology

## 2024-08-21 ENCOUNTER — Inpatient Hospital Stay

## 2024-08-21 ENCOUNTER — Encounter: Payer: Self-pay | Admitting: Oncology

## 2024-08-21 VITALS — BP 122/92 | HR 79 | Temp 98.6°F | Ht 70.0 in | Wt 252.0 lb

## 2024-08-21 DIAGNOSIS — N2889 Other specified disorders of kidney and ureter: Secondary | ICD-10-CM

## 2024-08-21 DIAGNOSIS — Z809 Family history of malignant neoplasm, unspecified: Secondary | ICD-10-CM | POA: Diagnosis not present

## 2024-08-21 LAB — CBC WITH DIFFERENTIAL/PLATELET
Abs Immature Granulocytes: 0.04 K/uL (ref 0.00–0.07)
Basophils Absolute: 0.1 K/uL (ref 0.0–0.1)
Basophils Relative: 1 %
Eosinophils Absolute: 0.3 K/uL (ref 0.0–0.5)
Eosinophils Relative: 3 %
HCT: 42.6 % (ref 39.0–52.0)
Hemoglobin: 14.9 g/dL (ref 13.0–17.0)
Immature Granulocytes: 0 %
Lymphocytes Relative: 27 %
Lymphs Abs: 2.5 K/uL (ref 0.7–4.0)
MCH: 30.3 pg (ref 26.0–34.0)
MCHC: 35 g/dL (ref 30.0–36.0)
MCV: 86.8 fL (ref 80.0–100.0)
Monocytes Absolute: 0.6 K/uL (ref 0.1–1.0)
Monocytes Relative: 7 %
Neutro Abs: 5.5 K/uL (ref 1.7–7.7)
Neutrophils Relative %: 62 %
Platelets: 297 K/uL (ref 150–400)
RBC: 4.91 MIL/uL (ref 4.22–5.81)
RDW: 11.8 % (ref 11.5–15.5)
WBC: 9 K/uL (ref 4.0–10.5)
nRBC: 0 % (ref 0.0–0.2)

## 2024-08-21 LAB — LACTATE DEHYDROGENASE: LDH: 181 U/L (ref 105–235)

## 2024-08-21 NOTE — Progress Notes (Signed)
 " Hematology/Oncology Consult note [phone] [phone]        REFERRING PROVIDER: [facility]   CHIEF COMPLAINTS/REASON FOR VISIT:  Evaluation of right renal mass   ASSESSMENT & PLAN:   Right renal mass Imaging results reviewed and discussed with patient.  Suspicious for renal cell carcinoma. Check CBC, LDH. Recommend CT chest with contrast to complete staging. Recommend patient to establish care with urology for evaluation of surgery. I plan to see patient back few weeks after the surgery to discuss about feasibility of adjuvant treatments.  Family history of cancer Family history of prostate cancer. Discussed with patient about genetic counseling.  He will defer for now and we will rediscuss in the future.   Orders Placed This Encounter  Procedures   CT Chest W Contrast    Standing Status:   Future    Expected Date:   08/28/2024    Expiration Date:   08/21/2025    If indicated for the ordered procedure, I authorize the administration of contrast media per Radiology protocol:   Yes    Does the patient have a contrast media/X-ray dye allergy?:   No    Preferred imaging location?:   [facility]   Lactate dehydrogenase    Standing Status:   Future    Number of Occurrences:   1    Expected Date:   08/21/2024    Expiration Date:   11/19/2024   CBC with Differential/Platelet    Standing Status:   Future    Number of Occurrences:   1    Expected Date:   08/21/2024    Expiration Date:   11/19/2024   Follow-up to be determined. All questions were answered. The patient knows to call the clinic with any problems, questions or concerns.  Zelphia Cap, MD, PhD [facility] Hematology Oncology 08/21/2024   HISTORY OF PRESENTING ILLNESS:   Maxwell Davis is a  59 y.o.  male with PMH listed below was seen in consultation at the request of  [facility]  for evaluation of right kidney mass.  Discussed the use of AI scribe software for  clinical note transcription with the patient, who gave verbal consent to proceed.   Hematuria began five days prior to presentation, characterized by grossly visible blood in the urine without associated pain. This was his first episode of hematuria. Hematuria has mostly resolved, though he continues to observe small clots in the urine; the urine is no longer grossly red.  Initial emergency department evaluation on 08/19/2024 included a CT abdomen pelvis without contrast showed a 6 0.5 x 6.5 cm right heterogeneous interpolar renal mass. Blood counts were stable. He was discharged for outpatient follow-up and subsequently established care with hematology/oncology and urology.  He returned to the emergency department on 08/20/2024 due to new right-sided flank pain, which developed while ambulating. The pain was localized to the area of the known renal mass and was described as worsening.   08/20/2024 CT abdomen pelvis with contrast showed  1. Two right renal masses consistent with renal cell carcinomas. No findings for tumor thrombus in the right renal vein or retroperitoneal lymphadenopathy. 2. No findings for metastatic disease involving the abdomen/pelvis or bony structures. 3. Diffuse fatty infiltration of the liver. 4. Stable benign mesenteritis or panniculitis.   He denies current tobacco use.  Hematuria has improved and urine cleared up  He is experiencing significant  stress due to his wife's critical illness but is coping adequately.  MEDICAL HISTORY:  Past Medical History:  Diagnosis Date   Essential hypertension    Headache    Hepatic steatosis    History of PSVT (paroxysmal supraventricular tachycardia)    Hyperlipidemia    Kidney stone    Morbid obesity (HCC)    OSA (obstructive sleep apnea)    Pre-diabetes    Seizures (HCC)    a. On dilantin .  Last seizure 1995.   Vasovagal syncope     SURGICAL HISTORY: Past Surgical History:  Procedure Laterality Date    COLONOSCOPY WITH PROPOFOL  N/A 06/10/2015   Procedure: COLONOSCOPY WITH PROPOFOL ;  Surgeon: Gladis RAYMOND Mariner, MD;  Location: [facility] ENDOSCOPY;  Service: Endoscopy;  Laterality: N/A;   COLONOSCOPY WITH PROPOFOL  N/A 11/19/2023   Procedure: COLONOSCOPY WITH PROPOFOL ;  Surgeon: Maryruth Ole DASEN, MD;  Location: [facility] ENDOSCOPY;  Service: Endoscopy;  Laterality: N/A;   FRACTURE SURGERY     ORIF TIBIAL SHAFT FRACTURE W/ PLATES AND SCREWS Right    right leg surgery     for tibial fracture    SOCIAL HISTORY: Social History   Socioeconomic History   Marital status: Married    Spouse name: Not on file   Number of children: Not on file   Years of education: Not on file   Highest education level: Not on file  Occupational History   Not on file  Tobacco Use   Smoking status: Never   Smokeless tobacco: Never  Vaping Use   Vaping status: Never Used  Substance and Sexual Activity   Alcohol use: No   Drug use: No   Sexual activity: Not on file  Other Topics Concern   Not on file  Social History Narrative   Lives in [place] with his wife and dog.  He does not routinely exercise.   Social Drivers of Health   Tobacco Use: Low Risk (08/21/2024)   Patient History    Smoking Tobacco Use: Never    Smokeless Tobacco Use: Never    Passive Exposure: Not on file  Financial Resource Strain: Low Risk  (06/12/2024)   Received from [facility]   Overall Financial Resource Strain (CARDIA)    Difficulty of Paying Living Expenses: Not very hard  Food Insecurity: No Food Insecurity (08/21/2024)   Epic    Worried About Running Out of Food in the Last Year: Never true    Ran Out of Food in the Last Year: Never true  Transportation Needs: No Transportation Needs (08/21/2024)   Epic    Lack of Transportation (Medical): No    Lack of Transportation (Non-Medical): No  Physical Activity: Insufficiently Active (06/12/2024)   Received from [facility]   Exercise Vital Sign     On average, how many days per week do you engage in moderate to strenuous exercise (like a brisk walk)?: 3 days    On average, how many minutes do you engage in exercise at this level?: 20 min  Stress: No Stress Concern Present (06/12/2024)   Received from [facility] - Occupational Stress Questionnaire    Feeling of Stress : Only a little  Social Connections: Moderately Integrated (06/12/2024)   Received from [facility]   Social Connection and Isolation Panel    In a typical week, how many times do you talk on the phone with family, friends, or neighbors?: More than three times a week    How often do you get together with  friends or relatives?: Twice a week    How often do you attend church or religious services?: Never    Do you belong to any clubs or organizations such as church groups, unions, fraternal or athletic groups, or school groups?: Yes    How often do you attend meetings of the clubs or organizations you belong to?: More than 4 times per year    Are you married, widowed, divorced, separated, never married, or living with a partner?: Married  Intimate Partner Violence: Not At Risk (08/21/2024)   Epic    Fear of Current or Ex-Partner: No    Emotionally Abused: No    Physically Abused: No    Sexually Abused: No  Depression (PHQ2-9): Low Risk (08/21/2024)   Depression (PHQ2-9)    PHQ-2 Score: 0  Alcohol Screen: Not on file  Housing: Low Risk (08/21/2024)   Epic    Unable to Pay for Housing in the Last Year: No    Number of Times Moved in the Last Year: 0    Homeless in the Last Year: No  Utilities: Not At Risk (08/21/2024)   Epic    Threatened with loss of utilities: No  Health Literacy: Adequate Health Literacy (06/12/2024)   Received from [facility]   [phone] Health Literacy    How often do you need to have someone help you when you read instructions, pamphlets, or other written  material from your doctor or pharmacy?: Never    FAMILY HISTORY: Family History  Problem Relation Age of Onset   Diabetes Mother        in her 31's   Hypertension Mother    Prostate cancer Father        in his 27's   CAD Maternal Grandmother    CAD Maternal Grandfather    Other Sister        alive and well   Heart attack Brother        died in his 86's    ALLERGIES:  is allergic to levetiracetam and penicillins.  MEDICATIONS:  Current Outpatient Medications  Medication Sig Dispense Refill   acetaminophen  (TYLENOL ) 500 MG tablet Take 2 tablets (1,000 mg total) by mouth every 6 (six) hours as needed. 100 tablet 2   aspirin  EC (ASPIRIN  81) 81 MG tablet Take 81 mg by mouth daily.     lisinopril -hydrochlorothiazide  (ZESTORETIC ) 10-12.5 MG tablet Take 1 tablet by mouth daily. 60 tablet 3   loratadine  (CLARITIN ) 10 MG tablet Take 10 mg by mouth daily as needed for allergies.     metformin (FORTAMET) 500 MG (OSM) 24 hr tablet Take 500 mg by mouth daily with breakfast.     polyethylene glycol (MIRALAX ) 17 g packet Take 17 g by mouth daily. 14 each 0   No current facility-administered medications for this visit.    Review of Systems  Constitutional:  Negative for appetite change, chills, fatigue and fever.  HENT:   Negative for hearing loss and voice change.   Eyes:  Negative for eye problems.  Respiratory:  Negative for chest tightness and cough.   Cardiovascular:  Negative for chest pain.  Gastrointestinal:  Negative for abdominal distention, abdominal pain and blood in stool.  Endocrine: Negative for hot flashes.  Genitourinary:  Positive for hematuria. Negative for difficulty urinating and frequency.   Musculoskeletal:  Negative for arthralgias.  Skin:  Negative for itching and rash.  Neurological:  Negative for extremity weakness.  Hematological:  Negative for adenopathy.  Psychiatric/Behavioral:  Negative for confusion.    PHYSICAL EXAMINATION:  Vitals:   08/21/24 1346   BP: (!) 122/92  Pulse: 79  Temp: 98.6 F (37 C)  SpO2: 98%   Filed Weights   08/21/24 1346  Weight: 252 lb (114.3 kg)    Physical Exam Constitutional:      General: He is not in acute distress. HENT:     Head: Normocephalic and atraumatic.  Eyes:     General: No scleral icterus. Cardiovascular:     Rate and Rhythm: Normal rate and regular rhythm.  Pulmonary:     Effort: Pulmonary effort is normal. No respiratory distress.     Breath sounds: Normal breath sounds. No wheezing.  Abdominal:     General: Bowel sounds are normal. There is no distension.     Palpations: Abdomen is soft.  Musculoskeletal:        General: No deformity. Normal range of motion.     Cervical back: Normal range of motion and neck supple.  Skin:    General: Skin is warm and dry.     Findings: No erythema or rash.  Neurological:     Mental Status: He is alert and oriented to person, place, and time. Mental status is at baseline.  Psychiatric:        Mood and Affect: Mood normal.     LABORATORY DATA:  I have reviewed the data as listed    Latest Ref Rng & Units 08/21/2024    2:24 PM 08/20/2024    3:02 PM 08/19/2024    7:09 AM  CBC  WBC 4.0 - 10.5 K/uL 9.0  11.1  7.6   Hemoglobin 13.0 - 17.0 g/dL 85.0  84.6  85.5   Hematocrit 39.0 - 52.0 % 42.6  44.6  41.9   Platelets 150 - 400 K/uL 297  325  273       Latest Ref Rng & Units 08/20/2024    3:02 PM 08/19/2024    7:09 AM 03/19/2022   12:25 PM  CMP  Glucose 70 - 99 mg/dL 882  802  896   BUN 6 - 20 mg/dL 18  16  19    Creatinine 0.61 - 1.24 mg/dL 9.02  9.05  8.98   Sodium 135 - 145 mmol/L 137  139  138   Potassium 3.5 - 5.1 mmol/L 3.9  4.2  3.5   Chloride 98 - 111 mmol/L 100  103  103   CO2 22 - 32 mmol/L 25  24  25    Calcium  8.9 - 10.3 mg/dL 89.7  9.7  9.2   Total Protein 6.5 - 8.1 g/dL 8.0     Total Bilirubin 0.0 - 1.2 mg/dL 0.5     Alkaline Phos 38 - 126 U/L 73     AST 15 - 41 U/L 31     ALT 0 - 44 U/L 47         RADIOGRAPHIC  STUDIES: I have personally reviewed the radiological images as listed and agreed with the findings in the report. CT ABDOMEN PELVIS W CONTRAST Result Date: 08/20/2024 CLINICAL DATA:  Right flank pain and left lower quadrant pain. Noncontrast CT scan suspected a right renal mass. Follow-up study. EXAM: CT ABDOMEN AND PELVIS WITH CONTRAST TECHNIQUE: Multidetector CT imaging of the abdomen and pelvis was performed using the standard protocol following bolus administration of intravenous contrast. RADIATION DOSE REDUCTION: This exam was performed according to the departmental dose-optimization program which includes automated exposure control, adjustment of the  mA and/or kV according to patient size and/or use of iterative reconstruction technique. CONTRAST:  OMNIPAQUE  IOHEXOL  300 MG/ML  SOLN COMPARISON:  CT scan from yesterday. FINDINGS: Lower chest: Small scattered calcified granulomas. No infiltrates or effusions. The heart is normal in size. No pericardial effusion. Hepatobiliary: Diffuse fatty infiltration of the liver no focal hepatic lesions or intrahepatic biliary dilatation. Gallbladder is unremarkable. No common bile duct dilatation. Pancreas: No mass, inflammation or ductal dilatation. Spleen: Normal size.  No focal lesions. Adrenals/Urinary Tract: The adrenal glands are normal. There are 2 right renal masses. The largest lesion and involves the interpolar region of the kidney laterally and measures approximately 6.5 x 5.9 x 5.2 cm. Very heterogeneous contrast enhancement. The second lesion is in the interpolar region anteriorly and medially. This measures 3.8 x 3.6 x 3.3 cm and has a very similar enhancement pattern. Findings consistent with renal cell carcinomas. No lesions involving the left kidney. Parapelvic left renal cysts are noted. There is a Foley catheter in the bladder. No bladder mass. Stomach/Bowel: The stomach, duodenum, small bowel and colon are unremarkable. No acute inflammatory  process, mass lesions or obstructive findings. The terminal ileum and appendix are normal. Vascular/Lymphatic: The aorta and branch vessels are normal. The major venous structures are patent. No findings for tumor thrombus in the right renal vein. No retroperitoneal lymphadenopathy. He see appearance of the small bowel mesentery with small scattered lymph nodes consistent with benign mesenteritis or panniculitis, unchanged since 2021. Reproductive: The prostate gland and seminal vesicles are unremarkable. Other: No ascites. Small periumbilical abdominal wall hernia containing fat. No inguinal hernia or adenopathy. Musculoskeletal: No lytic or sclerotic bone lesions to suggest metastatic disease. Degenerative changes in the thoracic and lumbar spine. IMPRESSION: 1. Two right renal masses consistent with renal cell carcinomas. No findings for tumor thrombus in the right renal vein or retroperitoneal lymphadenopathy. 2. No findings for metastatic disease involving the abdomen/pelvis or bony structures. 3. Diffuse fatty infiltration of the liver. 4. Stable benign mesenteritis or panniculitis. Electronically Signed   By: MYRTIS Stammer M.D.   On: 08/20/2024 17:41   CT ABDOMEN PELVIS WO CONTRAST Result Date: 08/19/2024 CLINICAL DATA:  Hematuria.  History of kidney stones. EXAM: CT ABDOMEN AND PELVIS WITHOUT CONTRAST TECHNIQUE: Multidetector CT imaging of the abdomen and pelvis was performed following the standard protocol without IV contrast. RADIATION DOSE REDUCTION: This exam was performed according to the departmental dose-optimization program which includes automated exposure control, adjustment of the mA and/or kV according to patient size and/or use of iterative reconstruction technique. COMPARISON:  02/28/2020 FINDINGS: Lower chest: Tiny calcified and noncalcified pulmonary nodules are seen at the lung bases. 2 mm right lower lobe noncalcified nodule image 38/4. 3 mm left lower lobe noncalcified nodule 29/4.  Right lower lobe nodule stable since prior 2021 exam consistent with benign etiology. Left lower lobe nodule stable since chest CT 02/18/2022 consistent with benign etiology. No followup imaging is recommended. Hepatobiliary: The liver shows diffusely decreased attenuation suggesting fat deposition. No suspicious focal abnormality in the liver on this study without intravenous contrast. There is no evidence for gallstones, gallbladder wall thickening, or pericholecystic fluid. No intrahepatic or extrahepatic biliary dilation. Pancreas: No focal mass lesion. No dilatation of the main duct. No intraparenchymal cyst. No peripancreatic edema. Spleen: No splenomegaly. No suspicious focal mass lesion. Adrenals/Urinary Tract: No adrenal nodule or mass. Probable central sinus cysts in the left kidney, stable since 2021 exam. Interval development of 6.5 x 6.5 cm heterogeneous interpolar right  renal mass, highly suspicious for renal cell carcinoma. No evidence for hydroureter. The urinary bladder appears normal for the degree of distention. Stomach/Bowel: Stomach is unremarkable. No gastric wall thickening. No evidence of outlet obstruction. Duodenum is normally positioned as is the ligament of Treitz. No small bowel wall thickening. No small bowel dilatation. The terminal ileum is normal. The appendix is not well visualized, but there is no edema or inflammation in the region of the cecal tip to suggest appendicitis. No gross colonic mass. No colonic wall thickening. Vascular/Lymphatic: No abdominal aortic aneurysm. No abdominal aortic atherosclerotic calcification. There is no gastrohepatic or hepatoduodenal ligament lymphadenopathy. No retroperitoneal or mesenteric lymphadenopathy. Upper normal cardiac caval lymph node on 34/2 is stable since 2021. Haziness in the central small bowel mesentery with increased number of small lymph nodes also unchanged and nonspecific but potentially reflecting prior inflammation or  infection. No pelvic sidewall lymphadenopathy. Reproductive: The prostate gland and seminal vesicles are unremarkable. Other: No substantial intraperitoneal free fluid. Musculoskeletal: No worrisome lytic or sclerotic osseous abnormality. IMPRESSION: 1. Interval development of 6.5 x 6.5 cm heterogeneous interpolar right renal mass, highly suspicious for renal cell carcinoma. MRI of the abdomen with and without contrast recommended to further evaluate. 2. No evidence for metastatic disease in the abdomen or pelvis. 3. Hepatic steatosis. Electronically Signed   By: Camellia Candle M.D.   On: 08/19/2024 08:49         "

## 2024-08-21 NOTE — Assessment & Plan Note (Signed)
 Family history of prostate cancer. Discussed with patient about genetic counseling.  He will defer for now and we will rediscuss in the future.

## 2024-08-21 NOTE — Assessment & Plan Note (Signed)
 Imaging results reviewed and discussed with patient.  Suspicious for renal cell carcinoma. Check CBC, LDH. Recommend CT chest with contrast to complete staging. Recommend patient to establish care with urology for evaluation of surgery. I plan to see patient back few weeks after the surgery to discuss about feasibility of adjuvant treatments.

## 2024-08-22 ENCOUNTER — Telehealth: Payer: Self-pay | Admitting: Oncology

## 2024-08-22 ENCOUNTER — Encounter: Payer: Self-pay | Admitting: Oncology

## 2024-08-22 NOTE — Telephone Encounter (Signed)
 Called pt to sched CT - pt notified of arrival 15 mins prior for reg - LH

## 2024-08-28 ENCOUNTER — Telehealth: Payer: Self-pay | Admitting: Oncology

## 2024-08-28 NOTE — Telephone Encounter (Signed)
 Called to let patient knoe that his scheduled CT scan will need to be canceled at this time due to pending insurance authorization. Once the authorization is approved, we will contact him to reschedule the appointment. I also sent Mychart message with appt cancellation details.

## 2024-08-29 ENCOUNTER — Ambulatory Visit

## 2024-08-30 ENCOUNTER — Telehealth: Payer: Self-pay | Admitting: *Deleted

## 2024-08-30 ENCOUNTER — Other Ambulatory Visit: Payer: Self-pay

## 2024-08-30 ENCOUNTER — Telehealth: Payer: Self-pay

## 2024-08-30 ENCOUNTER — Ambulatory Visit
Admission: RE | Admit: 2024-08-30 | Discharge: 2024-08-30 | Disposition: A | Source: Ambulatory Visit | Attending: Oncology

## 2024-08-30 ENCOUNTER — Telehealth: Payer: Self-pay | Admitting: Oncology

## 2024-08-30 ENCOUNTER — Inpatient Hospital Stay
Admission: EM | Admit: 2024-08-30 | Discharge: 2024-09-01 | DRG: 176 | Disposition: A | Discharge status: Home / Self Care | Attending: Family Medicine | Admitting: Family Medicine

## 2024-08-30 ENCOUNTER — Inpatient Hospital Stay

## 2024-08-30 ENCOUNTER — Emergency Department

## 2024-08-30 ENCOUNTER — Telehealth (HOSPITAL_BASED_OUTPATIENT_CLINIC_OR_DEPARTMENT_OTHER): Payer: Self-pay

## 2024-08-30 ENCOUNTER — Ambulatory Visit: Admitting: Urology

## 2024-08-30 ENCOUNTER — Ambulatory Visit: Admitting: Physician Assistant

## 2024-08-30 VITALS — BP 150/82 | HR 81 | Ht 70.0 in | Wt 252.0 lb

## 2024-08-30 DIAGNOSIS — Z8249 Family history of ischemic heart disease and other diseases of the circulatory system: Secondary | ICD-10-CM | POA: Diagnosis not present

## 2024-08-30 DIAGNOSIS — R310 Gross hematuria: Secondary | ICD-10-CM

## 2024-08-30 DIAGNOSIS — I471 Supraventricular tachycardia, unspecified: Secondary | ICD-10-CM | POA: Diagnosis present

## 2024-08-30 DIAGNOSIS — N2889 Other specified disorders of kidney and ureter: Secondary | ICD-10-CM | POA: Diagnosis not present

## 2024-08-30 DIAGNOSIS — K760 Fatty (change of) liver, not elsewhere classified: Secondary | ICD-10-CM | POA: Diagnosis present

## 2024-08-30 DIAGNOSIS — E119 Type 2 diabetes mellitus without complications: Secondary | ICD-10-CM | POA: Diagnosis present

## 2024-08-30 DIAGNOSIS — Z880 Allergy status to penicillin: Secondary | ICD-10-CM | POA: Diagnosis not present

## 2024-08-30 DIAGNOSIS — Z7982 Long term (current) use of aspirin: Secondary | ICD-10-CM | POA: Diagnosis not present

## 2024-08-30 DIAGNOSIS — I82409 Acute embolism and thrombosis of unspecified deep veins of unspecified lower extremity: Secondary | ICD-10-CM | POA: Diagnosis not present

## 2024-08-30 DIAGNOSIS — Z466 Encounter for fitting and adjustment of urinary device: Secondary | ICD-10-CM

## 2024-08-30 DIAGNOSIS — C641 Malignant neoplasm of right kidney, except renal pelvis: Secondary | ICD-10-CM | POA: Diagnosis present

## 2024-08-30 DIAGNOSIS — Z833 Family history of diabetes mellitus: Secondary | ICD-10-CM | POA: Diagnosis not present

## 2024-08-30 DIAGNOSIS — Z888 Allergy status to other drugs, medicaments and biological substances status: Secondary | ICD-10-CM

## 2024-08-30 DIAGNOSIS — G4733 Obstructive sleep apnea (adult) (pediatric): Secondary | ICD-10-CM | POA: Diagnosis present

## 2024-08-30 DIAGNOSIS — R319 Hematuria, unspecified: Secondary | ICD-10-CM

## 2024-08-30 DIAGNOSIS — Z79899 Other long term (current) drug therapy: Secondary | ICD-10-CM

## 2024-08-30 DIAGNOSIS — E785 Hyperlipidemia, unspecified: Secondary | ICD-10-CM | POA: Diagnosis present

## 2024-08-30 DIAGNOSIS — Z6836 Body mass index (BMI) 36.0-36.9, adult: Secondary | ICD-10-CM

## 2024-08-30 DIAGNOSIS — Z7984 Long term (current) use of oral hypoglycemic drugs: Secondary | ICD-10-CM

## 2024-08-30 DIAGNOSIS — I824Y1 Acute embolism and thrombosis of unspecified deep veins of right proximal lower extremity: Secondary | ICD-10-CM | POA: Diagnosis not present

## 2024-08-30 DIAGNOSIS — C649 Malignant neoplasm of unspecified kidney, except renal pelvis: Secondary | ICD-10-CM | POA: Diagnosis not present

## 2024-08-30 DIAGNOSIS — G40909 Epilepsy, unspecified, not intractable, without status epilepticus: Secondary | ICD-10-CM | POA: Diagnosis present

## 2024-08-30 DIAGNOSIS — I2699 Other pulmonary embolism without acute cor pulmonale: Principal | ICD-10-CM | POA: Diagnosis present

## 2024-08-30 DIAGNOSIS — Z8042 Family history of malignant neoplasm of prostate: Secondary | ICD-10-CM

## 2024-08-30 DIAGNOSIS — I10 Essential (primary) hypertension: Secondary | ICD-10-CM

## 2024-08-30 DIAGNOSIS — D6869 Other thrombophilia: Secondary | ICD-10-CM | POA: Diagnosis present

## 2024-08-30 DIAGNOSIS — I2609 Other pulmonary embolism with acute cor pulmonale: Secondary | ICD-10-CM | POA: Diagnosis not present

## 2024-08-30 LAB — BASIC METABOLIC PANEL WITH GFR
Anion gap: 13 (ref 5–15)
Anion gap: 11 (ref 5–15)
BUN: 13 mg/dL (ref 6–20)
BUN: 12 mg/dL (ref 6–20)
CO2: 23 mmol/L (ref 22–32)
CO2: 25 mmol/L (ref 22–32)
Calcium: 9.6 mg/dL (ref 8.9–10.3)
Calcium: 9.2 mg/dL (ref 8.9–10.3)
Chloride: 100 mmol/L (ref 98–111)
Chloride: 102 mmol/L (ref 98–111)
Creatinine, Ser: 0.84 mg/dL (ref 0.61–1.24)
Creatinine, Ser: 0.83 mg/dL (ref 0.61–1.24)
GFR, Estimated: >60 mL/min
GFR, Estimated: >60 mL/min
Glucose, Bld: 177 mg/dL — ABNORMAL HIGH (ref 70–99)
Glucose, Bld: 141 mg/dL — ABNORMAL HIGH (ref 70–99)
Potassium: 3.8 mmol/L (ref 3.5–5.1)
Potassium: 3.9 mmol/L (ref 3.5–5.1)
Sodium: 136 mmol/L (ref 135–145)
Sodium: 137 mmol/L (ref 135–145)

## 2024-08-30 LAB — CBC
HCT: 40.3 % (ref 39.0–52.0)
HCT: 40.4 % (ref 39.0–52.0)
Hemoglobin: 13.9 g/dL (ref 13.0–17.0)
Hemoglobin: 13.8 g/dL (ref 13.0–17.0)
MCH: 30 pg (ref 26.0–34.0)
MCH: 29.6 pg (ref 26.0–34.0)
MCHC: 34.5 g/dL (ref 30.0–36.0)
MCHC: 34.2 g/dL (ref 30.0–36.0)
MCV: 87 fL (ref 80.0–100.0)
MCV: 86.7 fL (ref 80.0–100.0)
Platelets: 278 K/uL (ref 150–400)
Platelets: 260 K/uL (ref 150–400)
RBC: 4.63 MIL/uL (ref 4.22–5.81)
RBC: 4.66 MIL/uL (ref 4.22–5.81)
RDW: 11.9 % (ref 11.5–15.5)
RDW: 11.8 % (ref 11.5–15.5)
WBC: 7.4 K/uL (ref 4.0–10.5)
WBC: 7 K/uL (ref 4.0–10.5)
nRBC: 0 % (ref 0.0–0.2)
nRBC: 0 % (ref 0.0–0.2)

## 2024-08-30 LAB — URINALYSIS, COMPLETE (UACMP) WITH MICROSCOPIC
Bacteria, UA: NONE SEEN
Bilirubin Urine: NEGATIVE
Glucose, UA: NEGATIVE mg/dL
Hgb urine dipstick: NEGATIVE
Ketones, ur: NEGATIVE mg/dL
Leukocytes,Ua: NEGATIVE
Nitrite: NEGATIVE
Protein, ur: NEGATIVE mg/dL
Specific Gravity, Urine: 1.027 (ref 1.005–1.030)
pH: 6 (ref 5.0–8.0)

## 2024-08-30 LAB — TYPE AND SCREEN
ABO/RH(D): A NEG
Antibody Screen: NEGATIVE

## 2024-08-30 LAB — APTT: aPTT: 29 s (ref 24–36)

## 2024-08-30 LAB — TROPONIN T, HIGH SENSITIVITY
Troponin T High Sensitivity: 13 ng/L (ref 0–19)
Troponin T High Sensitivity: 11 ng/L (ref 0–19)

## 2024-08-30 LAB — PROTIME-INR
INR: 1 (ref 0.8–1.2)
Prothrombin Time: 14.1 s (ref 11.4–15.2)

## 2024-08-30 LAB — CBG MONITORING, ED: Glucose-Capillary: 87 mg/dL (ref 70–99)

## 2024-08-30 MED ORDER — CHLORHEXIDINE GLUCONATE 4 % EX SOLN: 60.0000 mL | Freq: Once | CUTANEOUS | Status: DC

## 2024-08-30 MED ORDER — POLYETHYLENE GLYCOL 3350 17 G PO PACK
17.0000 g | PACK | Freq: Every day | ORAL | Status: DC
  Administered 2024-08-30 – 2024-09-01 (×2): 17 g via ORAL
  Filled 2024-08-30 (×2): qty 1

## 2024-08-30 MED ORDER — OXYCODONE HCL 5 MG PO TABS: 5.0000 mg | ORAL_TABLET | ORAL | Status: DC | PRN

## 2024-08-30 MED ORDER — HEPARIN (PORCINE) 25000 UT/250ML-% IV SOLN
1800.0000 [IU]/h | INTRAVENOUS | Status: DC
  Administered 2024-08-30: 1600 [IU]/h via INTRAVENOUS
  Administered 2024-08-31: 1800 [IU]/h via INTRAVENOUS
  Administered 2024-08-31: 1600 [IU]/h via INTRAVENOUS
  Administered 2024-08-31: 1800 [IU]/h via INTRAVENOUS
  Filled 2024-08-30 (×3): qty 250

## 2024-08-30 MED ORDER — ONDANSETRON HCL 4 MG/2ML IJ SOLN: 4.0000 mg | Freq: Four times a day (QID) | INTRAMUSCULAR | Status: DC | PRN

## 2024-08-30 MED ORDER — CEFAZOLIN SODIUM-DEXTROSE 2-4 GM/100ML-% IV SOLN: 2.0000 g | INTRAVENOUS | Status: DC

## 2024-08-30 MED ORDER — ONDANSETRON HCL 4 MG PO TABS: 4.0000 mg | ORAL_TABLET | Freq: Four times a day (QID) | ORAL | Status: DC | PRN

## 2024-08-30 MED ORDER — ACETAMINOPHEN 650 MG RE SUPP: 650.0000 mg | Freq: Four times a day (QID) | RECTAL | Status: DC | PRN

## 2024-08-30 MED ORDER — ASPIRIN 81 MG PO TBEC
81.0000 mg | DELAYED_RELEASE_TABLET | Freq: Every day | ORAL | Status: DC
  Administered 2024-08-30 – 2024-09-01 (×3): 81 mg via ORAL
  Filled 2024-08-30 (×3): qty 1

## 2024-08-30 MED ORDER — IOHEXOL 300 MG/ML  SOLN
75.0000 mL | Freq: Once | INTRAMUSCULAR | Status: AC | PRN
  Administered 2024-08-30: 75 mL via INTRAVENOUS

## 2024-08-30 MED ORDER — INSULIN ASPART 100 UNIT/ML IJ SOLN
0.0000 [IU] | Freq: Three times a day (TID) | INTRAMUSCULAR | Status: DC
  Administered 2024-08-31: 2 [IU] via SUBCUTANEOUS
  Filled 2024-08-30: qty 2

## 2024-08-30 MED ORDER — HEPARIN SODIUM (PORCINE) 5000 UNIT/ML IJ SOLN: 5000.0000 [IU] | INTRAMUSCULAR | Status: DC

## 2024-08-30 MED ORDER — LORAZEPAM 2 MG/ML IJ SOLN: 0.5000 mg | Freq: Four times a day (QID) | INTRAMUSCULAR | Status: DC | PRN

## 2024-08-30 MED ORDER — GADOBUTROL 1 MMOL/ML IV SOLN
10.0000 mL | Freq: Once | INTRAVENOUS | Status: AC | PRN
  Administered 2024-08-30: 10 mL via INTRAVENOUS

## 2024-08-30 MED ORDER — LORATADINE 10 MG PO TABS: 10.0000 mg | ORAL_TABLET | Freq: Every day | ORAL | Status: DC | PRN

## 2024-08-30 MED ORDER — TRAZODONE HCL 50 MG PO TABS: 25.0000 mg | ORAL_TABLET | Freq: Every evening | ORAL | Status: DC | PRN

## 2024-08-30 MED ORDER — ACETAMINOPHEN 325 MG PO TABS: 650.0000 mg | ORAL_TABLET | Freq: Four times a day (QID) | ORAL | Status: DC | PRN

## 2024-08-30 MED ORDER — SULFAMETHOXAZOLE-TRIMETHOPRIM 800-160 MG PO TABS
1.0000 | ORAL_TABLET | Freq: Once | ORAL | Status: AC
  Administered 2024-08-30: 1 via ORAL

## 2024-08-30 MED ORDER — HEPARIN BOLUS VIA INFUSION
6800.0000 [IU] | Freq: Once | INTRAVENOUS | Status: AC
  Administered 2024-08-30: 6800 [IU] via INTRAVENOUS
  Filled 2024-08-30: qty 6800

## 2024-08-30 MED ORDER — HYDRALAZINE HCL 20 MG/ML IJ SOLN: 5.0000 mg | Freq: Four times a day (QID) | INTRAMUSCULAR | Status: DC | PRN

## 2024-08-30 MED ORDER — SENNOSIDES-DOCUSATE SODIUM 8.6-50 MG PO TABS: 1.0000 | ORAL_TABLET | Freq: Every evening | ORAL | Status: DC | PRN

## 2024-08-30 NOTE — Progress Notes (Signed)
" °  Phone Number: [phone] for Surgical Coordinator Fax Number: [phone]  REQUEST FOR SURGICAL CLEARANCE      Date: 08/30/2024  [facility]ed to[facility]  Surgeon: Dr. Penne Skye, MD     Date of Surgery: 09/12/2024  Operation: Right Robotic Laparoscopic Radical Nephrectomy   Anesthesia Type: General   Diagnosis: Right Renal Mass  Patient Requires:   Cardiac / Vascular Clearance : Yes  Reason: Would like for patient to hold 81mg  ASA  Risk Assessment:    Low   []       Moderate   []     High   []           This patient is optimized for surgery  YES []       NO   []    I recommend further assessment/workup prior to surgery. YES []      NO  []   Appointment scheduled for: _______________________   Further recommendations: ____________________________________     Physician Signature:__________________________________   Printed Name: ________________________________________   Date: _________________    "

## 2024-08-30 NOTE — Telephone Encounter (Signed)
 Called pt to r/s CT - pt confirmed date/time/location - [facility]

## 2024-08-30 NOTE — ED Triage Notes (Signed)
 Pt to ED via POV from CT. Pt went to outpt imagining for CT scan due to having blood in urine and known renal mass. Pt sent due to CT showing bilateral PE. Pt denies SOB or CP. Pt is on blood thinners.

## 2024-08-30 NOTE — Consult Note (Signed)
 PHARMACY - ANTICOAGULATION CONSULT NOTE  Pharmacy Consult for heparin  dosing Indication: pulmonary embolus  Allergies[1]  Patient Measurements:    Vital Signs: Temp: 98.5 F (36.9 C) (01/21 1310) Temp Source: Oral (01/21 1310) BP: 149/85 (01/21 1310) Pulse Rate: 82 (01/21 1310)  Labs: Recent Labs    08/30/24 1311 08/30/24 1411  HGB 13.9 13.8  HCT 40.3 40.4  PLT 278 260  APTT  --  29  LABPROT  --  14.1  INR  --  1.0  CREATININE 0.84 0.83    Estimated Creatinine Clearance: 121.3 mL/min (by C-G formula based on SCr of 0.83 mg/dL).   Medical History: Past Medical History:  Diagnosis Date   Essential hypertension    Headache    Hepatic steatosis    History of PSVT (paroxysmal supraventricular tachycardia)    Hyperlipidemia    Kidney stone    Morbid obesity (HCC)    OSA (obstructive sleep apnea)    Pre-diabetes    Seizures (HCC)    a. On dilantin .  Last seizure 1995.   Vasovagal syncope     Medications:  No PTA anticoagulation  Assessment: Maxwell Davis is a 9 yoM presenting with concerns for pulmonary thromboembolism. Past medical history is notable for obesity (BMI 36), sleep apnea, and history of known renal mass. Patient arrived to ED due to concerns for bilateral PE at outpatient imaging appointment.   1/21 CT Chest:  Bilateral pulmonary emboli with the most proximal clot seen at the distal lobar level bilaterally. Positive for acute PE with CT evidence of right heart strain (RV/LV Ratio = 1.0) consistent with at least submassive (intermediate risk) PE  Baseline Hgb 13.8, plt 260, aPTT 29, and INR 1.0.  Goal of Therapy:  Heparin  level 0.3-0.7 units/ml Monitor platelets by anticoagulation protocol: Yes   Plan:  Baseline CBC, aPTT, and INR already ordered Give heparin  bolus 6800 units x 1, and start heparin  infusion at 1600 units/hr Check anti-Xa level in 6 hours and daily while on heparin  Continue to monitor H&H and platelets  Maxwell Davis,  PharmD Pharmacy Resident  08/30/2024 3:22 PM       [1]  Allergies Allergen Reactions   Levetiracetam Other (See Comments)    Irritable, mood swings   Penicillins Rash    Has patient had a PCN reaction causing immediate rash, facial/tongue/throat swelling, SOB or lightheadedness with hypotension: Unknown Has patient had a PCN reaction causing severe rash involving mucus membranes or skin necrosis: Unknown Has patient had a PCN reaction that required hospitalization: No Has patient had a PCN reaction occurring within the last 10 years: No If all of the above answers are NO, then may proceed with Cephalosporin use.

## 2024-08-30 NOTE — Progress Notes (Signed)
 Catheter Removal  Patient is present today for a catheter removal.  10ml of water was drained from the balloon. A 16FR foley cath was removed from the bladder, no complications were noted. Patient tolerated well.  Performed by: Beauford Browner, CCMA  Follow up/ Additional notes: Maxwell Davis will call to schedule surgery.

## 2024-08-30 NOTE — H&P (Signed)
 " History and Physical    Maxwell Davis FMW:[id number] DOB: 1965/09/05 DOA: 08/30/2024  PCP: Steva Clotilda DEL, NP (Confirm with patient/family/NH records and if not entered, this has to be entered at [facility] point of entry) Patient coming from: Home  I have personally briefly reviewed patient's old medical records in [facility]  Chief Complaint: I feel fine  HPI: Maxwell Davis is a 59 y.o. male with medical history significant of recently diagnosed right-sided renal cell carcinoma, HTN, IIDM, sent from urologist office for evaluation of incidental finding of bilateral PE on the CT study.  Patient has no complaints.  Patient was recently diagnosed with right-sided renal cell carcinoma after worsening of gross hematuria process.  Today he went to urologist office to have a CT chest for staging purposes, but it was found that he has a bilateral PE with signs of right heart strain.  Patient however denies any chest pain shortness of breath lightheadedness, no ankle edema or leg swelling.  ED workup showed pulse rate 82 blood pressure 140/80 O2 saturation 95% on room air.   Review of Systems: As per HPI otherwise 14 point review of systems negative.    Past Medical History:  Diagnosis Date   Essential hypertension    Headache    Hepatic steatosis    History of PSVT (paroxysmal supraventricular tachycardia)    Hyperlipidemia    Kidney stone    Morbid obesity (HCC)    OSA (obstructive sleep apnea)    Pre-diabetes    Seizures (HCC)    a. On dilantin .  Last seizure 1995.   Vasovagal syncope     Past Surgical History:  Procedure Laterality Date   COLONOSCOPY WITH PROPOFOL  N/A 06/10/2015   Procedure: COLONOSCOPY WITH PROPOFOL ;  Surgeon: Gladis RAYMOND Mariner, MD;  Location: [facility] ENDOSCOPY;  Service: Endoscopy;  Laterality: N/A;   COLONOSCOPY WITH PROPOFOL  N/A 11/19/2023   Procedure: COLONOSCOPY WITH PROPOFOL ;  Surgeon: Maryruth Ole DASEN, MD;  Location: [facility] ENDOSCOPY;  Service: Endoscopy;   Laterality: N/A;   FRACTURE SURGERY     ORIF TIBIAL SHAFT FRACTURE W/ PLATES AND SCREWS Right    right leg surgery     for tibial fracture     reports that he has never smoked. He has never used smokeless tobacco. He reports that he does not drink alcohol and does not use drugs.  Allergies[1]  Family History  Problem Relation Age of Onset   Diabetes Mother        in her 40's   Hypertension Mother    Prostate cancer Father        in his 63's   CAD Maternal Grandmother    CAD Maternal Grandfather    Other Sister        alive and well   Heart attack Brother        died in his 39's     Prior to Admission medications  Medication Sig Start Date End Date Taking? Authorizing Provider  acetaminophen  (TYLENOL ) 500 MG tablet Take 2 tablets (1,000 mg total) by mouth every 6 (six) hours as needed. 08/20/24 08/20/25 Yes Clarine Ozell LABOR, MD  aspirin  EC (ASPIRIN  81) 81 MG tablet Take 81 mg by mouth daily.   Yes [provider]  lisinopril -hydrochlorothiazide  (ZESTORETIC ) 10-12.5 MG tablet Take 1 tablet by mouth daily. 04/06/24  Yes Riddle, Suzann, NP  loratadine  (CLARITIN ) 10 MG tablet Take 10 mg by mouth daily as needed for allergies.   Yes [provider]  metformin (FORTAMET) 500 MG (OSM) 24 hr tablet Take 500 mg by mouth daily with breakfast.   Yes [provider]  polyethylene glycol (MIRALAX ) 17 g packet Take 17 g by mouth daily. 08/20/24  Yes Clarine Ozell LABOR, MD  phenytoin  (DILANTIN ) 100 MG ER capsule Take 300 mg by mouth at bedtime.   02/28/20  [provider]    Physical Exam: Vitals:   08/30/24 1310  BP: (!) 149/85  Pulse: 82  Resp: 16  Temp: 98.5 F (36.9 C)  TempSrc: Oral  SpO2: 95%    Constitutional: NAD, calm, comfortable Vitals:   08/30/24 1310  BP: (!) 149/85  Pulse: 82  Resp: 16  Temp: 98.5 F (36.9 C)  TempSrc: Oral  SpO2: 95%   Eyes: PERRL, lids and conjunctivae normal ENMT: Mucous membranes are moist. Posterior pharynx  clear of any exudate or lesions.Normal dentition.  Neck: normal, supple, no masses, no thyromegaly Respiratory: clear to auscultation bilaterally, no wheezing, no crackles. Normal respiratory effort. No accessory muscle use.  Cardiovascular: Regular rate and rhythm, no murmurs / rubs / gallops. No extremity edema. 2+ pedal pulses. No carotid bruits.  Abdomen: no tenderness, no masses palpated. No hepatosplenomegaly. Bowel sounds positive.  Musculoskeletal: no clubbing / cyanosis. No joint deformity upper and lower extremities. Good ROM, no contractures. Normal muscle tone.  Skin: no rashes, lesions, ulcers. No induration Neurologic: CN 2-12 grossly intact. Sensation intact, DTR normal. Strength 5/5 in all 4.  Psychiatric: Normal judgment and insight. Alert and oriented x 3. Normal mood.     Labs on Admission: I have personally reviewed following labs and imaging studies  CBC: Recent Labs  Lab 08/30/24 1311 08/30/24 1411  WBC 7.4 7.0  HGB 13.9 13.8  HCT 40.3 40.4  MCV 87.0 86.7  PLT 278 260   Basic Metabolic Panel: Recent Labs  Lab 08/30/24 1311 08/30/24 1411  NA 136 137  K 3.8 3.9  CL 100 102  CO2 23 25  GLUCOSE 177* 141*  BUN 13 12  CREATININE 0.84 0.83  CALCIUM  9.6 9.2   GFR: Estimated Creatinine Clearance: 121.3 mL/min (by C-G formula based on SCr of 0.83 mg/dL). Liver Function Tests: No results for input(s): AST, ALT, ALKPHOS, BILITOT, PROT, ALBUMIN in the last 168 hours. No results for input(s): LIPASE, AMYLASE in the last 168 hours. No results for input(s): AMMONIA in the last 168 hours. Coagulation Profile: Recent Labs  Lab 08/30/24 1411  INR 1.0   Cardiac Enzymes: No results for input(s): CKTOTAL, CKMB, CKMBINDEX, TROPONINI in the last 168 hours. BNP (last 3 results) No results for input(s): PROBNP in the last 8760 hours. HbA1C: No results for input(s): HGBA1C in the last 72 hours. CBG: No results for input(s): GLUCAP  in the last 168 hours. Lipid Profile: No results for input(s): CHOL, HDL, LDLCALC, TRIG, CHOLHDL, LDLDIRECT in the last 72 hours. Thyroid  Function Tests: No results for input(s): TSH, T4TOTAL, FREET4, T3FREE, THYROIDAB in the last 72 hours. Anemia Panel: No results for input(s): VITAMINB12, FOLATE, FERRITIN, TIBC, IRON, RETICCTPCT in the last 72 hours. Urine analysis:    Component Value Date/Time   COLORURINE YELLOW (A) 08/19/2024 0709   APPEARANCEUR HAZY (A) 08/19/2024 0709   LABSPEC 1.021 08/19/2024 0709   PHURINE 5.0 08/19/2024 0709   GLUCOSEU NEGATIVE 08/19/2024 0709   HGBUR LARGE (A) 08/19/2024 0709   BILIRUBINUR NEGATIVE 08/19/2024 0709   KETONESUR NEGATIVE 08/19/2024 0709   PROTEINUR NEGATIVE 08/19/2024 0709   NITRITE NEGATIVE 08/19/2024 0709  LEUKOCYTESUR NEGATIVE 08/19/2024 0709    Radiological Exams on Admission: CT HEAD WO CONTRAST ( ) Result Date: 08/30/2024 CLINICAL DATA:  Head CT of the chest done this a.m., blood clots present and was told to come to ER. EXAM: CT HEAD WITHOUT CONTRAST TECHNIQUE: Contiguous axial images were obtained from the base of the skull through the vertex without intravenous contrast. RADIATION DOSE REDUCTION: This exam was performed according to the departmental dose-optimization program which includes automated exposure control, adjustment of the mA and/or kV according to patient size and/or use of iterative reconstruction technique. COMPARISON:  None Available. FINDINGS: Brain: No evidence of acute infarction, hemorrhage, hydrocephalus, extra-axial collection or mass lesion/mass effect. Vascular: No hyperdense vessel or unexpected calcification. Skull: Normal. Negative for fracture or focal lesion. Sinuses/Orbits: No acute finding. Other: None. IMPRESSION: No acute intracranial pathology. Electronically Signed   By: Suzen Dials M.D.   On: 08/30/2024 14:25   CT Chest W Contrast Result Date:  08/30/2024 CLINICAL DATA:  Right renal mass, staging workup. * Tracking Code: BO * EXAM: CT CHEST WITH CONTRAST TECHNIQUE: Multidetector CT imaging of the chest was performed during intravenous contrast administration. RADIATION DOSE REDUCTION: This exam was performed according to the departmental dose-optimization program which includes automated exposure control, adjustment of the mA and/or kV according to patient size and/or use of iterative reconstruction technique. CONTRAST:  75mL OMNIPAQUE  IOHEXOL  300 MG/ML  SOLN COMPARISON:  CT abdomen 08/20/2024 and CT chest 02/18/2022. FINDINGS: Cardiovascular: Although the exam was not optimized for the detection of pulmonary emboli, there are bilateral pulmonary arterial filling defects with the most proximal clot seen at distal lobar levels in the right lower lobe and left upper lobe. RV/LV ratio 1.0. Heart is mildly enlarged. No pericardial effusion. Mediastinum/Nodes: No pathologically enlarged mediastinal, hilar or axillary lymph nodes. Esophagus is grossly unremarkable. Lungs/Pleura: Calcified granulomas. A few scattered pulmonary nodules measure up to 4 mm in the anterolateral left lower lobe (3/102), unchanged and benign. Minimal subpleural ground-glass in the medial left upper lobe (3/40), possibly due to postinflammatory scarring. No pleural fluid. Airway is unremarkable. Upper Abdomen: Liver appears mildly enlarged but is incompletely imaged. 3.4 x 3.6 cm heterogeneous mass in the anterior interpolar right kidney (2/164). Partially imaged second heterogeneous mass in the interpolar right kidney measures approximately 6.0 x 6.1 cm (2/170). Portacaval lymph node measures 1.6 cm, as before. Small gastrohepatic ligament lymph nodes. Visualized portions of the liver, gallbladder, adrenal glands, kidneys, spleen, pancreas, stomach and bowel are otherwise grossly unremarkable. No upper abdominal adenopathy. Musculoskeletal: Degenerative changes in the spine. No  worrisome lytic or sclerotic lesions. IMPRESSION: 1. Bilateral pulmonary emboli with the most proximal clot seen at the distal lobar level bilaterally. Positive for acute PE with CT evidence of right heart strain (RV/LV Ratio = 1.0) consistent with at least submassive (intermediate risk) PE. The presence of right heart strain has been associated with an increased risk of morbidity and mortality. Please refer to the Code PE Focused order set in EPIC. Critical Value/emergent results were called by telephone at the time of interpretation on 08/30/2024 at 11:40 am to provider HEATER CARMEN, RN , who verbally acknowledged these results. 2. Right renal masses, compatible with renal cell carcinomas. No evidence of metastatic disease. Electronically Signed   By: Newell Eke M.D.   On: 08/30/2024 11:40    EKG: Sinus rhythm, no acute ST changes.  Assessment/Plan Principal Problem:   PE (pulmonary thromboembolism) (HCC) Active Problems:   Acute pulmonary embolism (HCC)  (please populate  well all problems here in Problem List. (For example, if patient is on BP meds at home and you resume or decide to hold them, it is a problem that needs to be her. Same for CAD, COPD, HLD and so on)  Bilateral PE, submassive - Highly suspected hypercoagulable state secondary to renal carcinoma - Vascular surgeon consulted for possible thrombectomy. Vascular surgery reviewed CT image and found partially imaged thrombosis in IVC, and suspect the thrombus origins from the renal level, as per recommendation from urology, will order MRI with and without contrast to further investigate. - Admit to PCU for close monitoring - Heparin  drip, will need to monitor recurrent hematuria - DVT study and echocardiogram  Right-sided renal cell carcinoma - Scheduled for right-sided nephrectomy next month.  Vascular surgeon consulted for possible IVC filter for expected perioperation anticoagulation therapy interruption. - Currently there  is no hematuria  HTN - Hold off on BP meds given the size of the PE - Start as needed hydralazine   IIDM - Hold metformin for 48 hours - SSI  DVT prophylaxis: Heparin  drip Code Status: Full code Family Communication: Mother at bedside Disposition Plan: Patient sick with bilateral submassive PE requiring parenteral anticoagulation, inpatient vascular surgery consultation. Consults called: Vascular surgery, urology Admission status: PCU admit   Cort ONEIDA Mana MD [facility] Pager [phone]  08/30/2024, 4:28 PM       [1]  Allergies Allergen Reactions   Levetiracetam Other (See Comments)    Irritable, mood swings   Penicillins Rash    Has patient had a PCN reaction causing immediate rash, facial/tongue/throat swelling, SOB or lightheadedness with hypotension: Unknown Has patient had a PCN reaction causing severe rash involving mucus membranes or skin necrosis: Unknown Has patient had a PCN reaction that required hospitalization: No Has patient had a PCN reaction occurring within the last 10 years: No If all of the above answers are NO, then may proceed with Cephalosporin use.    "

## 2024-08-30 NOTE — Progress Notes (Signed)
" ° °   [facility] Surgical Posting Form  Surgery Date: Date: 09/12/2024  Surgeon: Dr. Penne Skye, MD  Inpt ( Yes  )   Outpt (No)   Obs ( No  )   Diagnosis: N28.80 Right Renal Mass  -CPT: [id number]  Surgery: Right Robotic Laparoscopic Radical Nephrectomy  Stop Anticoagulations: Yes and also hold ASA  Cardiac/Medical/Pulmonary Clearance needed: yes  Clearance needed from Dr: [facility]  Clearance request sent on: Date: 08/30/24  *Orders entered into EPIC  Date: 08/30/24   *Case booked in EPIC  Date: 08/30/24  *Notified pt of Surgery: Date: 08/30/24  PRE-OP UA & CX: yes, and will also obtain CBC, BMP, Type and Screen, PT/INR, PTT  *Placed into Prior Authorization Work [place] Date: 08/30/24  Assistant/laser/rep:No                "

## 2024-08-30 NOTE — Progress Notes (Signed)
 Surgical Physician Order Form [facility]  Dr. Penne Skye, MD  * Scheduling expectation : ASAP [facility] joint case)  *Length of Case: 2 hours  *Clearance needed: no  *Anticoagulation Instructions: Hold all anticoagulants  *Aspirin  Instructions: Hold Aspirin   *Post-op visit Date/Instructions:  per [place] (2 weeks Path review?)  *Diagnosis: Right renal mass  *Procedure: right robotic radical nephrectomy   Additional orders: N/A  -Admit type: OUTpatient  -Anesthesia: General  -VTE Prophylaxis Standing Order SCD's       Other: 5000 units subcutaneous heparin   -Standing Lab Orders Per Anesthesia    Lab other: Type&Screen  -Standing Test orders EKG/Chest x-ray per Anesthesia       Test other:   - Medications:  Ancef  2gm IV  -Other orders:  N/A

## 2024-08-30 NOTE — Telephone Encounter (Signed)
 Per Dr. Georganne, Patient is to be scheduled for Right Robotic Laparoscopic Radical Nephrectomy   Mr. Carelock was contacted and possible surgical dates were discussed, Tuesday February 3rd, 2026 was agreed upon for surgery.   Patient was directed to call [phone] between 1-3pm the day before surgery to find out surgical arrival time.  Instructions were given not to eat or drink from midnight on the night before surgery and have a driver for the day of surgery. On the surgery day patient was instructed to enter through the [facility] entrance of [facility] report the Same Day Surgery desk.   Pre-Admit Testing will be in contact via phone to set up an interview with the anesthesia team to review your history and medications prior to surgery.   Reminder of this information was sent via MyChart to the patient.

## 2024-08-30 NOTE — Telephone Encounter (Signed)
 SABRA

## 2024-08-30 NOTE — Progress Notes (Signed)
 "  08/30/24 8:38 AM   Maxwell Davis 03/07/1966 [id number]  CC: Right renal masses, gross hematuria  HPI: 59 year old male with medical history notable for obesity with BMI of 36, sleep apnea who is referred for gross hematuria and new renal masses seen on recent CT scan.  He developed gross hematuria and right flank pain after doing some lifting that persisted for 4 to 5 days prompting an ER visit.  CT abdomen and pelvis with contrast 08/20/2024 showed 2 right-sided renal masses, 1 potentially invading the collecting system centrally concerning for renal cell carcinoma, measuring 6.5 cm and 3.8 cm.  No left-sided renal masses, no evidence of metastatic disease.  CT chest is pending.  A Foley catheter was placed in the ER for unclear indications, from discussing today he denies any true retention or clot retention.  Urine has been yellow over the last few days.  He denies any prior abdominal surgeries.  Renal function normal, eGFR greater than 60  His wife currently remains hospitalized in the ICU with septic kidney stone/ARDS/pneumonia, I placed her ureteral stent 08/16/2024.  His mother lives close by and his sister is able to come stay with him for a week to help with postop care   PMH: Past Medical History:  Diagnosis Date   Essential hypertension    Headache    Hepatic steatosis    History of PSVT (paroxysmal supraventricular tachycardia)    Hyperlipidemia    Kidney stone    Morbid obesity (HCC)    OSA (obstructive sleep apnea)    Pre-diabetes    Seizures (HCC)    a. On dilantin .  Last seizure 1995.   Vasovagal syncope     Surgical History: Past Surgical History:  Procedure Laterality Date   COLONOSCOPY WITH PROPOFOL  N/A 06/10/2015   Procedure: COLONOSCOPY WITH PROPOFOL ;  Surgeon: Gladis RAYMOND Mariner, MD;  Location: [facility] ENDOSCOPY;  Service: Endoscopy;  Laterality: N/A;   COLONOSCOPY WITH PROPOFOL  N/A 11/19/2023   Procedure: COLONOSCOPY WITH PROPOFOL ;  Surgeon: Maryruth Ole DASEN, MD;  Location: [facility] ENDOSCOPY;  Service: Endoscopy;  Laterality: N/A;   FRACTURE SURGERY     ORIF TIBIAL SHAFT FRACTURE W/ PLATES AND SCREWS Right    right leg surgery     for tibial fracture     Family History: Family History  Problem Relation Age of Onset   Diabetes Mother        in her 94's   Hypertension Mother    Prostate cancer Father        in his 95's   CAD Maternal Grandmother    CAD Maternal Grandfather    Other Sister        alive and well   Heart attack Brother        died in his 27's    Social History:  reports that he has never smoked. He has never used smokeless tobacco. He reports that he does not drink alcohol and does not use drugs.  Physical Exam: BP (!) 150/82 (BP Location: Left Arm, Patient Position: Sitting, Cuff Size: Large)   Pulse 81   Ht 5' 10 (1.778 m)   Wt 252 lb (114.3 kg)   SpO2 94%   BMI 36.16 kg/m    Constitutional:  Alert and oriented, No acute distress. Cardiovascular: No clubbing, cyanosis, or edema. Respiratory: Normal respiratory effort, no increased work of breathing. GI: Abdomen is soft, nontender, nondistended, no abdominal masses  Laboratory Data: Reviewed, see HPI  Pertinent Imaging: I have personally  viewed and interpreted the CT abdomen and pelvis with contrast showing 2 enhancing right renal masses, largest approximately 7 cm with 1 laterally and 1 centrally concerning for collecting system invasion causing his gross hematuria, no evidence of metastatic disease.  CT chest is still pending  Assessment & Plan:   59 year old male with obesity and sleep apnea referred for gross hematuria and 2 right renal masses measuring 7 cm laterally and 4 cm centrally likely invading the collecting system and contributing to his gross hematuria.  Foley placed for unclear indications and urine is currently yellow, will can remove Foley today and discussed importance of increasing fluid intake if he develops recurrent gross hematuria as  well as risk of recurrent retention.  We discussed likely diagnosis of renal cell carcinoma and that surgery is the primary treatment which will address his gross hematuria as well as his cancer.  Discussed possible need for adjuvant systemic therapies through oncology pending pathology results.  Agree with CT chest to complete staging, but regardless of CT chest findings we will need right radical nephrectomy for symptomatic gross hematuria.  Risks of robotic right radical nephrectomy discussed including bleeding, infection, bowel injury, ileus, hernia, 1 to 2-day hospitalization were reviewed.  We also discussed risk of recurrence even if margins are negative.  Schedule right robotic radical nephrectomy with Dr. Georganne and myself on 09/12/2024 Will complete CT chest for staging this week Foley removed today  Addendum: Patient was sent to ER by oncology this morning after CT chest showed bilateral submassive PE with evidence of right-sided heart strain.  I discussed his case with Dr. Marea from vascular surgery as well as Dr. Babara with oncology.  Agree with admission for anticoagulation and further evaluation with MRI abdomen and pelvis to evaluate for any tumor thrombus/clot burden regarding possible IVC filter placement.  Will follow-up MRI today.  Unfortunately high risk for recurrent gross hematuria with anticoagulation, will continue to follow  I spent 75 total minutes on the day of the encounter including pre-visit review of the medical record, face-to-face time with the patient, and post visit ordering of labs/imaging/tests.   [name], MD 08/30/2024  [facility] [address], [address] [place], [place] [address] [phone]   "

## 2024-08-30 NOTE — ED Provider Notes (Signed)
 "  [facility] [address] Provider Note    Event Date/Time   First MD Initiated Contact with Patient 08/30/24 1325     (approximate)   History   Abnormal CT   HPI  Maxwell Davis is a 59 y.o. male with a history of known renal mass who comes in with concerns for CT showing bilateral PE.  Given there was concern for CT evidence of possible right heart strain patient was sent in here for evaluation.  Patient denies any terms of chest pain, shortness of breath.  He does report some occasional tingling in his left hand but it is more of a certain arm positions.     Physical Exam   Triage Vital Signs: ED Triage Vitals  Z  Encounter Vitals Group     BP (!) 149/85     Girls Systolic BP Percentile      Girls Diastolic BP Percentile      Boys Systolic BP Percentile      Boys Diastolic BP Percentile      Pulse Rate 82     Resp 16     Temp 98.5 F (36.9 C)     Temp Source Oral     SpO2 95 %     Weight      Height      Head Circumference      Peak Flow      Pain Score 0     Pain Loc      Pain Education      Exclude from Growth Chart     Most recent vital signs: Vitals:   08/30/24 1310  BP: (!) 149/85  Pulse: 82  Resp: 16  Temp: 98.5 F (36.9 C)  SpO2: 95%     General: Awake, no distress.  CV:  Good peripheral perfusion.  Resp:  Normal effort.  Abd:  No distention.  Soft and nontender Other:  Moving all extremities well   ED Results / Procedures / Treatments   Labs (all labs ordered are listed, but only abnormal results are displayed) Labs Reviewed  BASIC METABOLIC PANEL WITH GFR  CBC  TROPONIN T, HIGH SENSITIVITY     EKG  My interpretation of EKG:  Normal sinus rate of 70 without any ST elevation or T wave inversions, normal intervals  RADIOLOGY I have reviewed the ct personally and interpreted no evidence of intercranial mass or hemorrhage   PROCEDURES:  Critical Care performed: Yes, see critical care procedure note(s)  .1-3  Lead EKG Interpretation  Performed by: Ernest Ronal BRAVO, MD Authorized by: Ernest Ronal BRAVO, MD     Interpretation: normal     ECG rate:  80   ECG rate assessment: normal     Rhythm: sinus rhythm     Ectopy: none     Conduction: normal   .Critical Care  Performed by: Ernest Ronal BRAVO, MD Authorized by: Ernest Ronal BRAVO, MD   Critical care provider statement:    Critical care time (minutes):  30   Critical care was necessary to treat or prevent imminent or life-threatening deterioration of the following conditions: PE.   Critical care was time spent personally by me on the following activities:  Development of treatment plan with patient or surrogate, discussions with consultants, evaluation of patient's response to treatment, examination of patient, ordering and review of laboratory studies, ordering and review of radiographic studies, ordering and performing treatments and interventions, pulse oximetry, re-evaluation of patient's condition and review of old  charts    MEDICATIONS ORDERED IN ED: Medications  heparin  injection 5,000 Units (has no administration in time range)  chlorhexidine  (HIBICLENS ) 4 % liquid 4 Application (has no administration in time range)    And  chlorhexidine  (HIBICLENS ) 4 % liquid 4 Application (has no administration in time range)  ceFAZolin  (ANCEF ) IVPB 2g/100 mL premix (has no administration in time range)     IMPRESSION / MDM / ASSESSMENT AND PLAN / ED COURSE  I reviewed the triage vital signs and the nursing notes.   Patient's presentation is most consistent with acute presentation with potential threat to life or bodily function.   Patient comes in with concerns for pulmonary embolism from outpatient.  Did report a low bit of occasional tingling in his arm CT head ordered just to ensure no evidence of stroke, bleeding, mass before starting anticoagulation.  Discussed the case with vascular surgery dew as well as urology sninsky.  Patient will need to be  started on heparin  to see if he has hematuria that is worse and that would cause additional issues versus consideration of IVC filter with vascular surgery.  They recommended an MRI and admission.  I did discuss with them that I was going to admit to our hospital team I discussed the case with Dr. laurita  His troponin was negative, CBC shows reassuring hemoglobin.  BMP is reassuring    The patient is on the cardiac monitor to evaluate for evidence of arrhythmia and/or significant heart rate changes.      FINAL CLINICAL IMPRESSION(S) / ED DIAGNOSES   Final diagnoses:  Malignant neoplasm of kidney, unspecified laterality (HCC)  Other pulmonary embolism without acute cor pulmonale, unspecified chronicity (HCC)     Rx / DC Orders   ED Discharge Orders     None        Note:  This document was prepared using Dragon voice recognition software and may include unintentional dictation errors.   Ernest Ronal BRAVO, MD 08/30/24 1435  "

## 2024-08-30 NOTE — Telephone Encounter (Signed)
 Dwayne from [facility] radiology - Call report for CT scan RN returned phone call. Spoke with Dr. Jeremy at 1140 am. Read back process performed with radiologist. Patient has a bilateral PE with RV/LV Ratio at 1.0. Dr. Babara informed of critical CT scan value at 1148 am. Read back process performed with Dr. Babara.   Dr. Babara Please see report below  Bilateral pulmonary emboli with the most proximal clot seen at the distal lobar level bilaterally. Positive for acute PE with CT evidence of right heart strain (RV/LV Ratio = 1.0) consistent with at least submassive (intermediate risk) PE. The presence of right heart strain has been associated with an increased risk of morbidity and mortality. Please refer to the Code PE Focused order set in EPIC.

## 2024-08-30 NOTE — H&P (View-Only) (Signed)
 " [facility] Vascular Consult Note  MRN : [id number]  Maxwell Davis is a 59 y.o. (May 16, 1966) male who presents with chief complaint of  Chief Complaint  Patient presents with   Abnormal CT  .  History of Present Illness: I am asked to see the patient by Dr. Francisca for evaluation of pulmonary embolus with a known large right renal cell carcinoma.  He presented today and a CT scan of the chest which had been done for staging showed bilateral pulmonary embolus.  The official radiology report said there was right heart strain, but the clot volume is fairly small to medium and he has no chest pain, shortness of breath, oxygen requirement or other issues that would suggest this to be the case.  His renal masses are quite large and he has had intermittent hematuria.  He is not currently having hematuria, with his upcoming surgery and new PE diagnosis, anticoagulation may be difficult for him to tolerate.  Current Facility-Administered Medications  Medication Dose Route Frequency Provider Last Rate Last Admin   acetaminophen  (TYLENOL ) tablet 650 mg  650 mg Oral Q6H PRN Laurita Cort DASEN, MD       Or   acetaminophen  (TYLENOL ) suppository 650 mg  650 mg Rectal Q6H PRN Laurita Cort DASEN, MD       aspirin  EC tablet 81 mg  81 mg Oral Daily Laurita Cort T, MD       ceFAZolin  (ANCEF ) IVPB 2g/100 mL premix  2 g Intravenous 30 min Pre-Op Georganne Penne SAUNDERS, MD       chlorhexidine  (HIBICLENS ) 4 % liquid 4 Application  60 mL Topical Once Garren, Brandon R, MD       And   [START ON 08/31/2024] chlorhexidine  (HIBICLENS ) 4 % liquid 4 Application  60 mL Topical Once Garren, Brandon R, MD       heparin  bolus via infusion 6,800 Units  6,800 Units Intravenous Once Park, Leonor BROCKS, [facility]       Followed by   heparin  ADULT infusion 100 units/mL (25000 units/250mL)  1,600 Units/hr Intravenous Continuous Park, Leonor BROCKS, [place]       hydrALAZINE  (APRESOLINE ) injection 5 mg  5 mg Intravenous Q6H PRN Laurita Cort DASEN, MD       insulin  aspart (novoLOG ) injection 0-15 Units  0-15 Units Subcutaneous TID WC Zhang, Ping T, MD       loratadine  (CLARITIN ) tablet 10 mg  10 mg Oral Daily PRN Laurita Cort T, MD       LORazepam  (ATIVAN ) injection 0.5 mg  0.5 mg Intravenous Q6H PRN Laurita Cort T, MD       ondansetron  (ZOFRAN ) tablet 4 mg  4 mg Oral Q6H PRN Laurita Cort T, MD       Or   ondansetron  (ZOFRAN ) injection 4 mg  4 mg Intravenous Q6H PRN Laurita Cort T, MD       oxyCODONE  (Oxy IR/ROXICODONE ) immediate release tablet 5 mg  5 mg Oral Q4H PRN Laurita Cort T, MD       polyethylene glycol (MIRALAX  / GLYCOLAX ) packet 17 g  17 g Oral Daily Laurita Cort T, MD       senna-docusate (Senokot-S) tablet 1 tablet  1 tablet Oral QHS PRN Laurita Cort DASEN, MD       traZODone  (DESYREL ) tablet 25 mg  25 mg Oral QHS PRN Laurita Cort DASEN, MD       Current Outpatient Medications  Medication Sig Dispense Refill  acetaminophen  (TYLENOL ) 500 MG tablet Take 2 tablets (1,000 mg total) by mouth every 6 (six) hours as needed. 100 tablet 2   aspirin  EC (ASPIRIN  81) 81 MG tablet Take 81 mg by mouth daily.     lisinopril -hydrochlorothiazide  (ZESTORETIC ) 10-12.5 MG tablet Take 1 tablet by mouth daily. 60 tablet 3   loratadine  (CLARITIN ) 10 MG tablet Take 10 mg by mouth daily as needed for allergies.     metformin (FORTAMET) 500 MG (OSM) 24 hr tablet Take 500 mg by mouth daily with breakfast.     polyethylene glycol (MIRALAX ) 17 g packet Take 17 g by mouth daily. 14 each 0    Past Medical History:  Diagnosis Date   Essential hypertension    Headache    Hepatic steatosis    History of PSVT (paroxysmal supraventricular tachycardia)    Hyperlipidemia    Kidney stone    Morbid obesity (HCC)    OSA (obstructive sleep apnea)    Pre-diabetes    Seizures (HCC)    a. On dilantin .  Last seizure 1995.   Vasovagal syncope     Past Surgical History:  Procedure Laterality Date   COLONOSCOPY WITH PROPOFOL  N/A 06/10/2015   Procedure:  COLONOSCOPY WITH PROPOFOL ;  Surgeon: Gladis RAYMOND Mariner, MD;  Location: [facility] ENDOSCOPY;  Service: Endoscopy;  Laterality: N/A;   COLONOSCOPY WITH PROPOFOL  N/A 11/19/2023   Procedure: COLONOSCOPY WITH PROPOFOL ;  Surgeon: Maryruth Ole DASEN, MD;  Location: [facility] ENDOSCOPY;  Service: Endoscopy;  Laterality: N/A;   FRACTURE SURGERY     ORIF TIBIAL SHAFT FRACTURE W/ PLATES AND SCREWS Right    right leg surgery     for tibial fracture    Social History[1]   Family History  Problem Relation Age of Onset   Diabetes Mother        in her 63's   Hypertension Mother    Prostate cancer Father        in his 10's   CAD Maternal Grandmother    CAD Maternal Grandfather    Other Sister        alive and well   Heart attack Brother        died in his 68's    Allergies[2]   REVIEW OF SYSTEMS (Negative unless checked)  Constitutional: [] Weight loss  [] Fever  [] Chills Cardiac: [] Chest pain   [] Chest pressure   [x] Palpitations   [] Shortness of breath when laying flat   [] Shortness of breath at rest   [] Shortness of breath with exertion. Vascular:  [] Pain in legs with walking   [] Pain in legs at rest   [] Pain in legs when laying flat   [] Claudication   [] Pain in feet when walking  [] Pain in feet at rest  [] Pain in feet when laying flat   [] History of DVT   [x] Phlebitis   [] Swelling in legs   [] Varicose veins   [] Non-healing ulcers Pulmonary:   [] Uses home oxygen   [] Productive cough   [] Hemoptysis   [] Wheeze  [] COPD   [] Asthma Neurologic:  [] Dizziness  [] Blackouts   [x] Seizures   [] History of stroke   [] History of TIA  [] Aphasia   [] Temporary blindness   [] Dysphagia   [] Weakness or numbness in arms   [] Weakness or numbness in legs Musculoskeletal:  [x] Arthritis   [] Joint swelling   [x] Joint pain   [] Low back pain Hematologic:  [] Easy bruising  [] Easy bleeding   [] Hypercoagulable state   [] Anemic  [] Hepatitis Gastrointestinal:  [] Blood in stool   [] Vomiting blood  []   Gastroesophageal reflux/heartburn    [] Difficulty swallowing. Genitourinary:  [] Chronic kidney disease   [] Difficult urination  [] Frequent urination  [] Burning with urination   [x] Blood in urine Skin:  [] Rashes   [] Ulcers   [] Wounds Psychological:  [] History of anxiety   []  History of major depression.  Physical Examination  Vitals:   08/30/24 1310  BP: (!) 149/85  Pulse: 82  Resp: 16  Temp: 98.5 F (36.9 C)  TempSrc: Oral  SpO2: 95%   There is no height or weight on file to calculate BMI. Gen:  WD/WN, NAD Head: [place]/AT, No temporalis wasting.  Ear/Nose/Throat: Hearing grossly intact, nares w/o erythema or drainage, oropharynx w/o Erythema/Exudate Eyes: Sclera non-icteric, conjunctiva clear Neck: Trachea midline.  No JVD.  Pulmonary:  Good air movement, respirations not labored, equal bilaterally.  Cardiac: RRR, normal S1, S2. Vascular:  Vessel Right Left  Radial Palpable Palpable   Musculoskeletal: M/S 5/5 throughout.  Extremities without ischemic changes.  No deformity or atrophy. No edema. Neurologic: Sensation grossly intact in extremities.  Symmetrical.  Speech is fluent. Motor exam as listed above. Psychiatric: Judgment intact, Mood & affect appropriate for pt's clinical situation. Dermatologic: No rashes or ulcers noted.  No cellulitis or open wounds.      CBC Lab Results  Component Value Date   WBC 7.0 08/30/2024   HGB 13.8 08/30/2024   HCT 40.4 08/30/2024   MCV 86.7 08/30/2024   PLT 260 08/30/2024    BMET    Component Value Date/Time   NA 137 08/30/2024 1411   K 3.9 08/30/2024 1411   CL 102 08/30/2024 1411   CO2 25 08/30/2024 1411   GLUCOSE 141 (H) 08/30/2024 1411   BUN 12 08/30/2024 1411   CREATININE 0.83 08/30/2024 1411   CALCIUM  9.2 08/30/2024 1411   GFRNONAA >60 08/30/2024 1411   GFRAA >60 02/28/2020 0854   Estimated Creatinine Clearance: 121.3 mL/min (by C-G formula based on SCr of 0.83 mg/dL).  COAG Lab Results  Component Value Date   INR 1.0 08/30/2024   INR 1.1  02/18/2022    Radiology CT HEAD WO CONTRAST ( ) Result Date: 08/30/2024 CLINICAL DATA:  Head CT of the chest done this a.m., blood clots present and was told to come to ER. EXAM: CT HEAD WITHOUT CONTRAST TECHNIQUE: Contiguous axial images were obtained from the base of the skull through the vertex without intravenous contrast. RADIATION DOSE REDUCTION: This exam was performed according to the departmental dose-optimization program which includes automated exposure control, adjustment of the mA and/or kV according to patient size and/or use of iterative reconstruction technique. COMPARISON:  None Available. FINDINGS: Brain: No evidence of acute infarction, hemorrhage, hydrocephalus, extra-axial collection or mass lesion/mass effect. Vascular: No hyperdense vessel or unexpected calcification. Skull: Normal. Negative for fracture or focal lesion. Sinuses/Orbits: No acute finding. Other: None. IMPRESSION: No acute intracranial pathology. Electronically Signed   By: Suzen Dials M.D.   On: 08/30/2024 14:25   CT Chest W Contrast Result Date: 08/30/2024 CLINICAL DATA:  Right renal mass, staging workup. * Tracking Code: BO * EXAM: CT CHEST WITH CONTRAST TECHNIQUE: Multidetector CT imaging of the chest was performed during intravenous contrast administration. RADIATION DOSE REDUCTION: This exam was performed according to the departmental dose-optimization program which includes automated exposure control, adjustment of the mA and/or kV according to patient size and/or use of iterative reconstruction technique. CONTRAST:  75mL OMNIPAQUE  IOHEXOL  300 MG/ML  SOLN COMPARISON:  CT abdomen 08/20/2024 and CT chest 02/18/2022. FINDINGS: Cardiovascular: Although the exam was  not optimized for the detection of pulmonary emboli, there are bilateral pulmonary arterial filling defects with the most proximal clot seen at distal lobar levels in the right lower lobe and left upper lobe. RV/LV ratio 1.0. Heart is mildly  enlarged. No pericardial effusion. Mediastinum/Nodes: No pathologically enlarged mediastinal, hilar or axillary lymph nodes. Esophagus is grossly unremarkable. Lungs/Pleura: Calcified granulomas. A few scattered pulmonary nodules measure up to 4 mm in the anterolateral left lower lobe (3/102), unchanged and benign. Minimal subpleural ground-glass in the medial left upper lobe (3/40), possibly due to postinflammatory scarring. No pleural fluid. Airway is unremarkable. Upper Abdomen: Liver appears mildly enlarged but is incompletely imaged. 3.4 x 3.6 cm heterogeneous mass in the anterior interpolar right kidney (2/164). Partially imaged second heterogeneous mass in the interpolar right kidney measures approximately 6.0 x 6.1 cm (2/170). Portacaval lymph node measures 1.6 cm, as before. Small gastrohepatic ligament lymph nodes. Visualized portions of the liver, gallbladder, adrenal glands, kidneys, spleen, pancreas, stomach and bowel are otherwise grossly unremarkable. No upper abdominal adenopathy. Musculoskeletal: Degenerative changes in the spine. No worrisome lytic or sclerotic lesions. IMPRESSION: 1. Bilateral pulmonary emboli with the most proximal clot seen at the distal lobar level bilaterally. Positive for acute PE with CT evidence of right heart strain (RV/LV Ratio = 1.0) consistent with at least submassive (intermediate risk) PE. The presence of right heart strain has been associated with an increased risk of morbidity and mortality. Please refer to the Code PE Focused order set in EPIC. Critical Value/emergent results were called by telephone at the time of interpretation on 08/30/2024 at 11:40 am to provider HEATER CARMEN, RN , who verbally acknowledged these results. 2. Right renal masses, compatible with renal cell carcinomas. No evidence of metastatic disease. Electronically Signed   By: Newell Eke M.D.   On: 08/30/2024 11:40   CT ABDOMEN PELVIS W CONTRAST Result Date: 08/20/2024 CLINICAL DATA:   Right flank pain and left lower quadrant pain. Noncontrast CT scan suspected a right renal mass. Follow-up study. EXAM: CT ABDOMEN AND PELVIS WITH CONTRAST TECHNIQUE: Multidetector CT imaging of the abdomen and pelvis was performed using the standard protocol following bolus administration of intravenous contrast. RADIATION DOSE REDUCTION: This exam was performed according to the departmental dose-optimization program which includes automated exposure control, adjustment of the mA and/or kV according to patient size and/or use of iterative reconstruction technique. CONTRAST:  OMNIPAQUE  IOHEXOL  300 MG/ML  SOLN COMPARISON:  CT scan from yesterday. FINDINGS: Lower chest: Small scattered calcified granulomas. No infiltrates or effusions. The heart is normal in size. No pericardial effusion. Hepatobiliary: Diffuse fatty infiltration of the liver no focal hepatic lesions or intrahepatic biliary dilatation. Gallbladder is unremarkable. No common bile duct dilatation. Pancreas: No mass, inflammation or ductal dilatation. Spleen: Normal size.  No focal lesions. Adrenals/Urinary Tract: The adrenal glands are normal. There are 2 right renal masses. The largest lesion and involves the interpolar region of the kidney laterally and measures approximately 6.5 x 5.9 x 5.2 cm. Very heterogeneous contrast enhancement. The second lesion is in the interpolar region anteriorly and medially. This measures 3.8 x 3.6 x 3.3 cm and has a very similar enhancement pattern. Findings consistent with renal cell carcinomas. No lesions involving the left kidney. Parapelvic left renal cysts are noted. There is a Foley catheter in the bladder. No bladder mass. Stomach/Bowel: The stomach, duodenum, small bowel and colon are unremarkable. No acute inflammatory process, mass lesions or obstructive findings. The terminal ileum and appendix are normal. Vascular/Lymphatic:  The aorta and branch vessels are normal. The major venous structures are  patent. No findings for tumor thrombus in the right renal vein. No retroperitoneal lymphadenopathy. He see appearance of the small bowel mesentery with small scattered lymph nodes consistent with benign mesenteritis or panniculitis, unchanged since 2021. Reproductive: The prostate gland and seminal vesicles are unremarkable. Other: No ascites. Small periumbilical abdominal wall hernia containing fat. No inguinal hernia or adenopathy. Musculoskeletal: No lytic or sclerotic bone lesions to suggest metastatic disease. Degenerative changes in the thoracic and lumbar spine. IMPRESSION: 1. Two right renal masses consistent with renal cell carcinomas. No findings for tumor thrombus in the right renal vein or retroperitoneal lymphadenopathy. 2. No findings for metastatic disease involving the abdomen/pelvis or bony structures. 3. Diffuse fatty infiltration of the liver. 4. Stable benign mesenteritis or panniculitis. Electronically Signed   By: MYRTIS Stammer M.D.   On: 08/20/2024 17:41   CT ABDOMEN PELVIS WO CONTRAST Result Date: 08/19/2024 CLINICAL DATA:  Hematuria.  History of kidney stones. EXAM: CT ABDOMEN AND PELVIS WITHOUT CONTRAST TECHNIQUE: Multidetector CT imaging of the abdomen and pelvis was performed following the standard protocol without IV contrast. RADIATION DOSE REDUCTION: This exam was performed according to the departmental dose-optimization program which includes automated exposure control, adjustment of the mA and/or kV according to patient size and/or use of iterative reconstruction technique. COMPARISON:  02/28/2020 FINDINGS: Lower chest: Tiny calcified and noncalcified pulmonary nodules are seen at the lung bases. 2 mm right lower lobe noncalcified nodule image 38/4. 3 mm left lower lobe noncalcified nodule 29/4. Right lower lobe nodule stable since prior 2021 exam consistent with benign etiology. Left lower lobe nodule stable since chest CT 02/18/2022 consistent with benign etiology. No followup  imaging is recommended. Hepatobiliary: The liver shows diffusely decreased attenuation suggesting fat deposition. No suspicious focal abnormality in the liver on this study without intravenous contrast. There is no evidence for gallstones, gallbladder wall thickening, or pericholecystic fluid. No intrahepatic or extrahepatic biliary dilation. Pancreas: No focal mass lesion. No dilatation of the main duct. No intraparenchymal cyst. No peripancreatic edema. Spleen: No splenomegaly. No suspicious focal mass lesion. Adrenals/Urinary Tract: No adrenal nodule or mass. Probable central sinus cysts in the left kidney, stable since 2021 exam. Interval development of 6.5 x 6.5 cm heterogeneous interpolar right renal mass, highly suspicious for renal cell carcinoma. No evidence for hydroureter. The urinary bladder appears normal for the degree of distention. Stomach/Bowel: Stomach is unremarkable. No gastric wall thickening. No evidence of outlet obstruction. Duodenum is normally positioned as is the ligament of Treitz. No small bowel wall thickening. No small bowel dilatation. The terminal ileum is normal. The appendix is not well visualized, but there is no edema or inflammation in the region of the cecal tip to suggest appendicitis. No gross colonic mass. No colonic wall thickening. Vascular/Lymphatic: No abdominal aortic aneurysm. No abdominal aortic atherosclerotic calcification. There is no gastrohepatic or hepatoduodenal ligament lymphadenopathy. No retroperitoneal or mesenteric lymphadenopathy. Upper normal cardiac caval lymph node on 34/2 is stable since 2021. Haziness in the central small bowel mesentery with increased number of small lymph nodes also unchanged and nonspecific but potentially reflecting prior inflammation or infection. No pelvic sidewall lymphadenopathy. Reproductive: The prostate gland and seminal vesicles are unremarkable. Other: No substantial intraperitoneal free fluid. Musculoskeletal: No  worrisome lytic or sclerotic osseous abnormality. IMPRESSION: 1. Interval development of 6.5 x 6.5 cm heterogeneous interpolar right renal mass, highly suspicious for renal cell carcinoma. MRI of the abdomen with and without  contrast recommended to further evaluate. 2. No evidence for metastatic disease in the abdomen or pelvis. 3. Hepatic steatosis. Electronically Signed   By: Camellia Candle M.D.   On: 08/19/2024 08:49      Assessment/Plan 1.  Pulmonary embolus.  This is a difficult situation and initially, I would try to anticoagulate him and see how bad the hematuria is.  An IVC filter is reasonable if the clot burden came from the legs and not the renal cell carcinoma which is known to create caval thrombosis.  If that is the case, an IVC filter may be technically possible, but that would be questionable and we would have to assess the size of his suprarenal vena cava and if there is an area spared of thrombus to place the filter.  He is get an MRI today which should help from that and have also ordered lower extremity venous Dopplers to look for lower extremity DVT.  I would try anticoagulation for now and see how his hematuria is. 2.  Renal cell carcinoma.  Needs resection.  Causing issues as above and could be causing IVC thrombosis and pulmonary embolus as well.  Further testing ongoing. 3.  Hypertension.  Stable on outpatient medications and blood pressure control important in reducing the progression of atherosclerotic disease. On appropriate oral medications.    Selinda Gu, MD  08/30/2024 4:07 PM    This note was created with Dragon medical transcription system.  Any error is purely unintentional    [1]  Social History Tobacco Use   Smoking status: Never   Smokeless tobacco: Never  Vaping Use   Vaping status: Never Used  Substance Use Topics   Alcohol use: No   Drug use: No  [2]  Allergies Allergen Reactions   Levetiracetam Other (See Comments)    Irritable, mood swings    Penicillins Rash    Has patient had a PCN reaction causing immediate rash, facial/tongue/throat swelling, SOB or lightheadedness with hypotension: Unknown Has patient had a PCN reaction causing severe rash involving mucus membranes or skin necrosis: Unknown Has patient had a PCN reaction that required hospitalization: No Has patient had a PCN reaction occurring within the last 10 years: No If all of the above answers are NO, then may proceed with Cephalosporin use.    "

## 2024-08-30 NOTE — Patient Instructions (Addendum)
 Please call [phone] or [phone] to schedule your imaging prior to your appointment.    Minimally Invasive Nephrectomy Minimally invasive nephrectomy is a surgery to remove a kidney. The body has two kidneys. They work as a filter to clean the blood and remove waste. The kidneys also remove extra fluid from the body by making pee (urine). This surgery may be done to: Remove the entire kidney, adrenal gland, and some structures around them. This is called a radical nephrectomy. Remove only the damaged or diseased part of the kidney. This is called a partial nephrectomy. You may need this surgery if: Your kidney is badly damaged from disease, infection, injury, or cancer. You were born with an abnormal kidney. You are donating a healthy kidney. Tell a health care provider about: Any allergies you have. All medicines you take. These include vitamins, herbs, eye drops, and creams. Any problems you or family members have had with anesthesia. Any bleeding problems you have. Any surgeries you've had. Any medical conditions you have. Whether you're pregnant or may be pregnant. What are the risks? Your health care provider will talk with you about risks. These may include: Bleeding. Infection. Damage to other body structures near the kidney. Pulmonary embolism. This is when a blood clot forms in the leg and moves to the lung. Allergic reactions to medicines. Kidney failure. Changing to an open procedure. This may happen if a bone, usually part of a rib, needs to be removed so the surgeon can get to the kidney. What happens before the surgery? When to stop eating and drinking Eat and drink only as you've been told. You may be told this: 8 hours before your surgery Stop eating most foods. Do not eat meat, fried foods, or fatty foods. Eat only light foods, such as toast or crackers. All liquids are okay except energy drinks and alcohol. 6 hours before your surgery Stop eating. Drink  only clear liquids, such as water, clear fruit juice, black coffee, plain tea, and sports drinks. Do not drink energy drinks or alcohol. 2 hours before your surgery Stop drinking all liquids. You may be allowed to take medicines with small sips of water. If you do not eat and drink as told, your surgery may be delayed or canceled. Medicines Ask about changing or stopping: Any medicines you take. Any vitamins, herbs, or supplements you take. Do not take aspirin  or ibuprofen unless you're told to. Surgery safety For your safety, you may: Need to wash your skin with a soap that kills germs. Get antibiotics. Have your surgery site marked. Have hair removed at the surgery site. General instructions Do not smoke, vape, or use nicotine or tobacco for at least 4 weeks before the surgery. You might have your blood drawn and tested in case you need to receive blood, or get a transfusion, during surgery. What happens during the surgery?     An IV will be put into a vein in your hand or arm. You may be given: A sedative to help you relax. Anesthesia to keep you from feeling pain. A soft tube called a catheter will be placed in your bladder to drain pee. Several small cuts will be made in your belly. Special tools called trocars will be inserted through these cuts. Your belly will be filled with a gas. This gives the surgeon more room to work. A laparoscope will be put through one of the trocars. This is a thin, lighted tube with a camera on the end. The camera  will send images to a screen in the operating room. Surgical tools will be put through the other cuts. Sometimes, the surgeon may control these tools using a robot. This is called a robot-assisted nephrectomy. If you're having a partial nephrectomy: The blood vessels attached to your kidney will be clamped. Part of your kidney will be removed. The kidney will be closed with stitches or staples, and the clamp on the blood vessels will  be removed. If you're having a radical nephrectomy: All of the blood vessels that attach to your kidney will be tied off and separated from the kidney. Part of your ureter will be removed. The ureter is the tube that carries pee from your kidney to your bladder. A larger cut may be made in your lower belly to take out the kidney. A small tube (drain) may be placed near one of the cuts to drain extra fluid. The cuts will be closed with stitches, staples, skin glue, or tape strips. A bandage will be placed over the area. These steps may vary. Ask what you can expect. What happens after the surgery? You'll be watched closely until you leave. This includes checking your pain level, blood pressure, heart rate, and breathing rate. You may have: A urinary catheter. How much you pee will be checked. A drain. Your surgical drain will be removed after a few days. Pain medicine given through your IV. Compression stockings to reduce swelling and help prevent blood clots in your legs. You'll be told to: Get out of bed and walk as soon as you can. Do breathing exercises, like coughing and breathing deeply. This helps prevent pneumonia. Where to find more information American Cancer Society: cancer.org This information is not intended to replace advice given to you by your health care provider. Make sure you discuss any questions you have with your health care provider. Kidney Cancer  Kidney cancer is a type of cancer in which an abnormal growth of cells (tumor) forms in one or both kidneys. The kidneys filter waste from your blood and make urine. Kidney cancer may spread to other parts of your body. This type of cancer may also be called renal cell carcinoma. What are the causes? The cause of this condition is not known. What increases the risk? You may be more likely to develop kidney cancer if: You are over age 20. You have certain conditions that are passed from parent to child (inherited). These  include von Hippel-Lindau disease, tuberous sclerosis, and hereditary papillary renal carcinoma. You smoke or have been exposed to certain chemicals. You have advanced kidney disease, especially if you need to use a machine to clean your blood (dialysis). You are obese. You have high blood pressure (hypertension). You are male. What are the signs or symptoms? At first, kidney cancer may not cause any symptoms. As the cancer grows, symptoms may include: Blood in the urine. Pain in the upper back or abdomen, just below the rib cage. You may feel pain on one or both sides of your body. Tiredness (fatigue). Weight loss that cannot be explained. Fever. How is this diagnosed? This condition may be diagnosed based on: Your symptoms. Your medical history. A physical exam. You may also have tests, such as: Blood and urine tests. X-rays. Imaging tests, such as CT scans, MRIs, and PET scans. Angiogram. This is when dye is injected into your blood to show your blood vessels. Intravenous pyelogram. This is when dye is injected into your blood to show your kidneys and  the other organs that help with making and storing urine. Biopsy. This is when a piece of tissue is removed from your kidney and looked at under a microscope. Your cancer will be assessed (staged), based on how severe it is and how much it has spread. How is this treated? Treatment depends on the type and stage of the cancer. Treatment may include: Surgery to remove: Just the tumor (nephron-sparing surgery). The entire kidney (nephrectomy). The kidney, some of the healthy tissue around it, nearby lymph nodes, and sometimes the adrenal gland (radical nephrectomy). Medicines to: Kill cancer cells (chemotherapy). Help your body's disease-fighting system (immune system) fight cancer cells. This is known as immunotherapy. Radiation therapy. This uses high-energy rays to kill cancer cells. Targeted therapy to only attack genes and  proteins that allow a tumor to grow. This type of therapy tries to limit damage to your healthy cells. Cryoablation. This uses gas or liquid to freeze cancer cells. It is sent through a needle. Radiofrequency ablation. This uses high-energy radio waves to destroy cancer cells. It is sent through a needle-like probe. Embolization. This is a procedure to block the artery that supplies blood to the tumor. Follow these instructions at home: Eating and drinking Some of your treatments may affect your appetite and your ability to chew and swallow. If you have problems eating, or if you do not have an appetite, meet with an expert in diet and nutrition (dietitian). If you have side effects that affect eating, it may help to: Eat smaller meals more often. Eat bland or soft foods. Avoid foods that are hot, spicy, or hard to swallow. Drink shakes or supplements that are high in nutrition and calories. Do not drink alcohol. Lifestyle Do not use any products that contain nicotine or tobacco. These products include cigarettes, chewing tobacco, and vaping devices, such as e-cigarettes. If you need help quitting, ask your health care provider. Get enough sleep. Most adults need 6-8 hours of sleep each night. During treatment, you may need more sleep. General instructions Take over-the-counter and prescription medicines only as told by your health care provider. Consider joining a support group. This can help you cope with the stress of having kidney cancer. Work with your health care provider to manage any side effects of treatment. Keep all follow-up visits. Your health care provider will want to make sure your treatment is working. Where to find more information American Cancer Society: cancer.org [employer] ([employer]): cancer.gov Contact a health care provider if: You bruise or bleed easily. You lose weight without trying. You have new or worse fatigue or weakness. You have a fever. Your  pain suddenly increases. You have more blood in your urine. Your skin or the white parts of your eyes turn yellow (jaundice). Get help right away if: You have chest pain. You are short of breath. You have irregular heartbeats (palpitations). These symptoms may be an emergency. Get help right away. Call 911. Do not wait to see if the symptoms will go away. Do not drive yourself to the hospital. This information is not intended to replace advice given to you by your health care provider. Make sure you discuss any questions you have with your health care provider. Document Revised: 01/06/2022 Document Reviewed: 01/06/2022 [employer] Patient Education  2024 [employer].

## 2024-08-30 NOTE — Consult Note (Signed)
 " [facility] Vascular Consult Note  MRN : [id number]  Maxwell Davis is a 59 y.o. (1966-02-11) male who presents with chief complaint of  Chief Complaint  Patient presents with   Abnormal CT  .  History of Present Illness: I am asked to see the patient by Dr. Francisca for evaluation of pulmonary embolus with a known large right renal cell carcinoma.  He presented today and a CT scan of the chest which had been done for staging showed bilateral pulmonary embolus.  The official radiology report said there was right heart strain, but the clot volume is fairly small to medium and he has no chest pain, shortness of breath, oxygen requirement or other issues that would suggest this to be the case.  His renal masses are quite large and he has had intermittent hematuria.  He is not currently having hematuria, with his upcoming surgery and new PE diagnosis, anticoagulation may be difficult for him to tolerate.  Current Facility-Administered Medications  Medication Dose Route Frequency Provider Last Rate Last Admin   acetaminophen  (TYLENOL ) tablet 650 mg  650 mg Oral Q6H PRN Laurita Cort DASEN, MD       Or   acetaminophen  (TYLENOL ) suppository 650 mg  650 mg Rectal Q6H PRN Laurita Cort DASEN, MD       aspirin  EC tablet 81 mg  81 mg Oral Daily Laurita Cort T, MD       ceFAZolin  (ANCEF ) IVPB 2g/100 mL premix  2 g Intravenous 30 min Pre-Op Georganne Penne SAUNDERS, MD       chlorhexidine  (HIBICLENS ) 4 % liquid 4 Application  60 mL Topical Once Garren, Brandon R, MD       And   [START ON 08/31/2024] chlorhexidine  (HIBICLENS ) 4 % liquid 4 Application  60 mL Topical Once Garren, Brandon R, MD       heparin  bolus via infusion 6,800 Units  6,800 Units Intravenous Once Park, Leonor BROCKS, [facility]       Followed by   heparin  ADULT infusion 100 units/mL (25000 units/250mL)  1,600 Units/hr Intravenous Continuous Park, Leonor BROCKS, [place]       hydrALAZINE  (APRESOLINE ) injection 5 mg  5 mg Intravenous Q6H PRN Laurita Cort DASEN, MD       insulin  aspart (novoLOG ) injection 0-15 Units  0-15 Units Subcutaneous TID WC Zhang, Ping T, MD       loratadine  (CLARITIN ) tablet 10 mg  10 mg Oral Daily PRN Laurita Cort T, MD       LORazepam  (ATIVAN ) injection 0.5 mg  0.5 mg Intravenous Q6H PRN Laurita Cort T, MD       ondansetron  (ZOFRAN ) tablet 4 mg  4 mg Oral Q6H PRN Laurita Cort T, MD       Or   ondansetron  (ZOFRAN ) injection 4 mg  4 mg Intravenous Q6H PRN Laurita Cort T, MD       oxyCODONE  (Oxy IR/ROXICODONE ) immediate release tablet 5 mg  5 mg Oral Q4H PRN Laurita Cort T, MD       polyethylene glycol (MIRALAX  / GLYCOLAX ) packet 17 g  17 g Oral Daily Laurita Cort T, MD       senna-docusate (Senokot-S) tablet 1 tablet  1 tablet Oral QHS PRN Laurita Cort DASEN, MD       traZODone  (DESYREL ) tablet 25 mg  25 mg Oral QHS PRN Laurita Cort DASEN, MD       Current Outpatient Medications  Medication Sig Dispense Refill  acetaminophen  (TYLENOL ) 500 MG tablet Take 2 tablets (1,000 mg total) by mouth every 6 (six) hours as needed. 100 tablet 2   aspirin  EC (ASPIRIN  81) 81 MG tablet Take 81 mg by mouth daily.     lisinopril -hydrochlorothiazide  (ZESTORETIC ) 10-12.5 MG tablet Take 1 tablet by mouth daily. 60 tablet 3   loratadine  (CLARITIN ) 10 MG tablet Take 10 mg by mouth daily as needed for allergies.     metformin (FORTAMET) 500 MG (OSM) 24 hr tablet Take 500 mg by mouth daily with breakfast.     polyethylene glycol (MIRALAX ) 17 g packet Take 17 g by mouth daily. 14 each 0    Past Medical History:  Diagnosis Date   Essential hypertension    Headache    Hepatic steatosis    History of PSVT (paroxysmal supraventricular tachycardia)    Hyperlipidemia    Kidney stone    Morbid obesity (HCC)    OSA (obstructive sleep apnea)    Pre-diabetes    Seizures (HCC)    a. On dilantin .  Last seizure 1995.   Vasovagal syncope     Past Surgical History:  Procedure Laterality Date   COLONOSCOPY WITH PROPOFOL  N/A 06/10/2015   Procedure:  COLONOSCOPY WITH PROPOFOL ;  Surgeon: Gladis RAYMOND Mariner, MD;  Location: [facility] ENDOSCOPY;  Service: Endoscopy;  Laterality: N/A;   COLONOSCOPY WITH PROPOFOL  N/A 11/19/2023   Procedure: COLONOSCOPY WITH PROPOFOL ;  Surgeon: Maryruth Ole DASEN, MD;  Location: [facility] ENDOSCOPY;  Service: Endoscopy;  Laterality: N/A;   FRACTURE SURGERY     ORIF TIBIAL SHAFT FRACTURE W/ PLATES AND SCREWS Right    right leg surgery     for tibial fracture    Social History[1]   Family History  Problem Relation Age of Onset   Diabetes Mother        in her 64's   Hypertension Mother    Prostate cancer Father        in his 76's   CAD Maternal Grandmother    CAD Maternal Grandfather    Other Sister        alive and well   Heart attack Brother        died in his 30's    Allergies[2]   REVIEW OF SYSTEMS (Negative unless checked)  Constitutional: [] Weight loss  [] Fever  [] Chills Cardiac: [] Chest pain   [] Chest pressure   [x] Palpitations   [] Shortness of breath when laying flat   [] Shortness of breath at rest   [] Shortness of breath with exertion. Vascular:  [] Pain in legs with walking   [] Pain in legs at rest   [] Pain in legs when laying flat   [] Claudication   [] Pain in feet when walking  [] Pain in feet at rest  [] Pain in feet when laying flat   [] History of DVT   [x] Phlebitis   [] Swelling in legs   [] Varicose veins   [] Non-healing ulcers Pulmonary:   [] Uses home oxygen   [] Productive cough   [] Hemoptysis   [] Wheeze  [] COPD   [] Asthma Neurologic:  [] Dizziness  [] Blackouts   [x] Seizures   [] History of stroke   [] History of TIA  [] Aphasia   [] Temporary blindness   [] Dysphagia   [] Weakness or numbness in arms   [] Weakness or numbness in legs Musculoskeletal:  [x] Arthritis   [] Joint swelling   [x] Joint pain   [] Low back pain Hematologic:  [] Easy bruising  [] Easy bleeding   [] Hypercoagulable state   [] Anemic  [] Hepatitis Gastrointestinal:  [] Blood in stool   [] Vomiting blood  []   Gastroesophageal reflux/heartburn    [] Difficulty swallowing. Genitourinary:  [] Chronic kidney disease   [] Difficult urination  [] Frequent urination  [] Burning with urination   [x] Blood in urine Skin:  [] Rashes   [] Ulcers   [] Wounds Psychological:  [] History of anxiety   []  History of major depression.  Physical Examination  Vitals:   08/30/24 1310  BP: (!) 149/85  Pulse: 82  Resp: 16  Temp: 98.5 F (36.9 C)  TempSrc: Oral  SpO2: 95%   There is no height or weight on file to calculate BMI. Gen:  WD/WN, NAD Head: [place]/AT, No temporalis wasting.  Ear/Nose/Throat: Hearing grossly intact, nares w/o erythema or drainage, oropharynx w/o Erythema/Exudate Eyes: Sclera non-icteric, conjunctiva clear Neck: Trachea midline.  No JVD.  Pulmonary:  Good air movement, respirations not labored, equal bilaterally.  Cardiac: RRR, normal S1, S2. Vascular:  Vessel Right Left  Radial Palpable Palpable   Musculoskeletal: M/S 5/5 throughout.  Extremities without ischemic changes.  No deformity or atrophy. No edema. Neurologic: Sensation grossly intact in extremities.  Symmetrical.  Speech is fluent. Motor exam as listed above. Psychiatric: Judgment intact, Mood & affect appropriate for pt's clinical situation. Dermatologic: No rashes or ulcers noted.  No cellulitis or open wounds.      CBC Lab Results  Component Value Date   WBC 7.0 08/30/2024   HGB 13.8 08/30/2024   HCT 40.4 08/30/2024   MCV 86.7 08/30/2024   PLT 260 08/30/2024    BMET    Component Value Date/Time   NA 137 08/30/2024 1411   K 3.9 08/30/2024 1411   CL 102 08/30/2024 1411   CO2 25 08/30/2024 1411   GLUCOSE 141 (H) 08/30/2024 1411   BUN 12 08/30/2024 1411   CREATININE 0.83 08/30/2024 1411   CALCIUM  9.2 08/30/2024 1411   GFRNONAA >60 08/30/2024 1411   GFRAA >60 02/28/2020 0854   Estimated Creatinine Clearance: 121.3 mL/min (by C-G formula based on SCr of 0.83 mg/dL).  COAG Lab Results  Component Value Date   INR 1.0 08/30/2024   INR 1.1  02/18/2022    Radiology CT HEAD WO CONTRAST ( ) Result Date: 08/30/2024 CLINICAL DATA:  Head CT of the chest done this a.m., blood clots present and was told to come to ER. EXAM: CT HEAD WITHOUT CONTRAST TECHNIQUE: Contiguous axial images were obtained from the base of the skull through the vertex without intravenous contrast. RADIATION DOSE REDUCTION: This exam was performed according to the departmental dose-optimization program which includes automated exposure control, adjustment of the mA and/or kV according to patient size and/or use of iterative reconstruction technique. COMPARISON:  None Available. FINDINGS: Brain: No evidence of acute infarction, hemorrhage, hydrocephalus, extra-axial collection or mass lesion/mass effect. Vascular: No hyperdense vessel or unexpected calcification. Skull: Normal. Negative for fracture or focal lesion. Sinuses/Orbits: No acute finding. Other: None. IMPRESSION: No acute intracranial pathology. Electronically Signed   By: Suzen Dials M.D.   On: 08/30/2024 14:25   CT Chest W Contrast Result Date: 08/30/2024 CLINICAL DATA:  Right renal mass, staging workup. * Tracking Code: BO * EXAM: CT CHEST WITH CONTRAST TECHNIQUE: Multidetector CT imaging of the chest was performed during intravenous contrast administration. RADIATION DOSE REDUCTION: This exam was performed according to the departmental dose-optimization program which includes automated exposure control, adjustment of the mA and/or kV according to patient size and/or use of iterative reconstruction technique. CONTRAST:  75mL OMNIPAQUE  IOHEXOL  300 MG/ML  SOLN COMPARISON:  CT abdomen 08/20/2024 and CT chest 02/18/2022. FINDINGS: Cardiovascular: Although the exam was  not optimized for the detection of pulmonary emboli, there are bilateral pulmonary arterial filling defects with the most proximal clot seen at distal lobar levels in the right lower lobe and left upper lobe. RV/LV ratio 1.0. Heart is mildly  enlarged. No pericardial effusion. Mediastinum/Nodes: No pathologically enlarged mediastinal, hilar or axillary lymph nodes. Esophagus is grossly unremarkable. Lungs/Pleura: Calcified granulomas. A few scattered pulmonary nodules measure up to 4 mm in the anterolateral left lower lobe (3/102), unchanged and benign. Minimal subpleural ground-glass in the medial left upper lobe (3/40), possibly due to postinflammatory scarring. No pleural fluid. Airway is unremarkable. Upper Abdomen: Liver appears mildly enlarged but is incompletely imaged. 3.4 x 3.6 cm heterogeneous mass in the anterior interpolar right kidney (2/164). Partially imaged second heterogeneous mass in the interpolar right kidney measures approximately 6.0 x 6.1 cm (2/170). Portacaval lymph node measures 1.6 cm, as before. Small gastrohepatic ligament lymph nodes. Visualized portions of the liver, gallbladder, adrenal glands, kidneys, spleen, pancreas, stomach and bowel are otherwise grossly unremarkable. No upper abdominal adenopathy. Musculoskeletal: Degenerative changes in the spine. No worrisome lytic or sclerotic lesions. IMPRESSION: 1. Bilateral pulmonary emboli with the most proximal clot seen at the distal lobar level bilaterally. Positive for acute PE with CT evidence of right heart strain (RV/LV Ratio = 1.0) consistent with at least submassive (intermediate risk) PE. The presence of right heart strain has been associated with an increased risk of morbidity and mortality. Please refer to the Code PE Focused order set in EPIC. Critical Value/emergent results were called by telephone at the time of interpretation on 08/30/2024 at 11:40 am to provider HEATER CARMEN, RN , who verbally acknowledged these results. 2. Right renal masses, compatible with renal cell carcinomas. No evidence of metastatic disease. Electronically Signed   By: Newell Eke M.D.   On: 08/30/2024 11:40   CT ABDOMEN PELVIS W CONTRAST Result Date: 08/20/2024 CLINICAL DATA:   Right flank pain and left lower quadrant pain. Noncontrast CT scan suspected a right renal mass. Follow-up study. EXAM: CT ABDOMEN AND PELVIS WITH CONTRAST TECHNIQUE: Multidetector CT imaging of the abdomen and pelvis was performed using the standard protocol following bolus administration of intravenous contrast. RADIATION DOSE REDUCTION: This exam was performed according to the departmental dose-optimization program which includes automated exposure control, adjustment of the mA and/or kV according to patient size and/or use of iterative reconstruction technique. CONTRAST:  OMNIPAQUE  IOHEXOL  300 MG/ML  SOLN COMPARISON:  CT scan from yesterday. FINDINGS: Lower chest: Small scattered calcified granulomas. No infiltrates or effusions. The heart is normal in size. No pericardial effusion. Hepatobiliary: Diffuse fatty infiltration of the liver no focal hepatic lesions or intrahepatic biliary dilatation. Gallbladder is unremarkable. No common bile duct dilatation. Pancreas: No mass, inflammation or ductal dilatation. Spleen: Normal size.  No focal lesions. Adrenals/Urinary Tract: The adrenal glands are normal. There are 2 right renal masses. The largest lesion and involves the interpolar region of the kidney laterally and measures approximately 6.5 x 5.9 x 5.2 cm. Very heterogeneous contrast enhancement. The second lesion is in the interpolar region anteriorly and medially. This measures 3.8 x 3.6 x 3.3 cm and has a very similar enhancement pattern. Findings consistent with renal cell carcinomas. No lesions involving the left kidney. Parapelvic left renal cysts are noted. There is a Foley catheter in the bladder. No bladder mass. Stomach/Bowel: The stomach, duodenum, small bowel and colon are unremarkable. No acute inflammatory process, mass lesions or obstructive findings. The terminal ileum and appendix are normal. Vascular/Lymphatic:  The aorta and branch vessels are normal. The major venous structures are  patent. No findings for tumor thrombus in the right renal vein. No retroperitoneal lymphadenopathy. He see appearance of the small bowel mesentery with small scattered lymph nodes consistent with benign mesenteritis or panniculitis, unchanged since 2021. Reproductive: The prostate gland and seminal vesicles are unremarkable. Other: No ascites. Small periumbilical abdominal wall hernia containing fat. No inguinal hernia or adenopathy. Musculoskeletal: No lytic or sclerotic bone lesions to suggest metastatic disease. Degenerative changes in the thoracic and lumbar spine. IMPRESSION: 1. Two right renal masses consistent with renal cell carcinomas. No findings for tumor thrombus in the right renal vein or retroperitoneal lymphadenopathy. 2. No findings for metastatic disease involving the abdomen/pelvis or bony structures. 3. Diffuse fatty infiltration of the liver. 4. Stable benign mesenteritis or panniculitis. Electronically Signed   By: MYRTIS Stammer M.D.   On: 08/20/2024 17:41   CT ABDOMEN PELVIS WO CONTRAST Result Date: 08/19/2024 CLINICAL DATA:  Hematuria.  History of kidney stones. EXAM: CT ABDOMEN AND PELVIS WITHOUT CONTRAST TECHNIQUE: Multidetector CT imaging of the abdomen and pelvis was performed following the standard protocol without IV contrast. RADIATION DOSE REDUCTION: This exam was performed according to the departmental dose-optimization program which includes automated exposure control, adjustment of the mA and/or kV according to patient size and/or use of iterative reconstruction technique. COMPARISON:  02/28/2020 FINDINGS: Lower chest: Tiny calcified and noncalcified pulmonary nodules are seen at the lung bases. 2 mm right lower lobe noncalcified nodule image 38/4. 3 mm left lower lobe noncalcified nodule 29/4. Right lower lobe nodule stable since prior 2021 exam consistent with benign etiology. Left lower lobe nodule stable since chest CT 02/18/2022 consistent with benign etiology. No followup  imaging is recommended. Hepatobiliary: The liver shows diffusely decreased attenuation suggesting fat deposition. No suspicious focal abnormality in the liver on this study without intravenous contrast. There is no evidence for gallstones, gallbladder wall thickening, or pericholecystic fluid. No intrahepatic or extrahepatic biliary dilation. Pancreas: No focal mass lesion. No dilatation of the main duct. No intraparenchymal cyst. No peripancreatic edema. Spleen: No splenomegaly. No suspicious focal mass lesion. Adrenals/Urinary Tract: No adrenal nodule or mass. Probable central sinus cysts in the left kidney, stable since 2021 exam. Interval development of 6.5 x 6.5 cm heterogeneous interpolar right renal mass, highly suspicious for renal cell carcinoma. No evidence for hydroureter. The urinary bladder appears normal for the degree of distention. Stomach/Bowel: Stomach is unremarkable. No gastric wall thickening. No evidence of outlet obstruction. Duodenum is normally positioned as is the ligament of Treitz. No small bowel wall thickening. No small bowel dilatation. The terminal ileum is normal. The appendix is not well visualized, but there is no edema or inflammation in the region of the cecal tip to suggest appendicitis. No gross colonic mass. No colonic wall thickening. Vascular/Lymphatic: No abdominal aortic aneurysm. No abdominal aortic atherosclerotic calcification. There is no gastrohepatic or hepatoduodenal ligament lymphadenopathy. No retroperitoneal or mesenteric lymphadenopathy. Upper normal cardiac caval lymph node on 34/2 is stable since 2021. Haziness in the central small bowel mesentery with increased number of small lymph nodes also unchanged and nonspecific but potentially reflecting prior inflammation or infection. No pelvic sidewall lymphadenopathy. Reproductive: The prostate gland and seminal vesicles are unremarkable. Other: No substantial intraperitoneal free fluid. Musculoskeletal: No  worrisome lytic or sclerotic osseous abnormality. IMPRESSION: 1. Interval development of 6.5 x 6.5 cm heterogeneous interpolar right renal mass, highly suspicious for renal cell carcinoma. MRI of the abdomen with and without  contrast recommended to further evaluate. 2. No evidence for metastatic disease in the abdomen or pelvis. 3. Hepatic steatosis. Electronically Signed   By: Camellia Candle M.D.   On: 08/19/2024 08:49      Assessment/Plan 1.  Pulmonary embolus.  This is a difficult situation and initially, I would try to anticoagulate him and see how bad the hematuria is.  An IVC filter is reasonable if the clot burden came from the legs and not the renal cell carcinoma which is known to create caval thrombosis.  If that is the case, an IVC filter may be technically possible, but that would be questionable and we would have to assess the size of his suprarenal vena cava and if there is an area spared of thrombus to place the filter.  He is get an MRI today which should help from that and have also ordered lower extremity venous Dopplers to look for lower extremity DVT.  I would try anticoagulation for now and see how his hematuria is. 2.  Renal cell carcinoma.  Needs resection.  Causing issues as above and could be causing IVC thrombosis and pulmonary embolus as well.  Further testing ongoing. 3.  Hypertension.  Stable on outpatient medications and blood pressure control important in reducing the progression of atherosclerotic disease. On appropriate oral medications.    Selinda Gu, MD  08/30/2024 4:07 PM    This note was created with Dragon medical transcription system.  Any error is purely unintentional    [1]  Social History Tobacco Use   Smoking status: Never   Smokeless tobacco: Never  Vaping Use   Vaping status: Never Used  Substance Use Topics   Alcohol use: No   Drug use: No  [2]  Allergies Allergen Reactions   Levetiracetam Other (See Comments)    Irritable, mood swings    Penicillins Rash    Has patient had a PCN reaction causing immediate rash, facial/tongue/throat swelling, SOB or lightheadedness with hypotension: Unknown Has patient had a PCN reaction causing severe rash involving mucus membranes or skin necrosis: Unknown Has patient had a PCN reaction that required hospitalization: No Has patient had a PCN reaction occurring within the last 10 years: No If all of the above answers are NO, then may proceed with Cephalosporin use.    "

## 2024-08-31 ENCOUNTER — Other Ambulatory Visit: Payer: Self-pay

## 2024-08-31 ENCOUNTER — Inpatient Hospital Stay
Admit: 2024-08-31 | Discharge: 2024-08-31 | Disposition: A | Discharge status: Home / Self Care | Attending: Internal Medicine | Admitting: Internal Medicine

## 2024-08-31 ENCOUNTER — Inpatient Hospital Stay

## 2024-08-31 ENCOUNTER — Encounter
Admission: EM | Disposition: A | Discharge status: Home / Self Care | Payer: Self-pay | Source: Home / Self Care | Attending: Family Medicine

## 2024-08-31 DIAGNOSIS — R319 Hematuria, unspecified: Secondary | ICD-10-CM | POA: Diagnosis not present

## 2024-08-31 DIAGNOSIS — I82409 Acute embolism and thrombosis of unspecified deep veins of unspecified lower extremity: Secondary | ICD-10-CM | POA: Diagnosis not present

## 2024-08-31 DIAGNOSIS — I824Y1 Acute embolism and thrombosis of unspecified deep veins of right proximal lower extremity: Secondary | ICD-10-CM | POA: Diagnosis not present

## 2024-08-31 DIAGNOSIS — C649 Malignant neoplasm of unspecified kidney, except renal pelvis: Secondary | ICD-10-CM | POA: Diagnosis not present

## 2024-08-31 DIAGNOSIS — I2609 Other pulmonary embolism with acute cor pulmonale: Secondary | ICD-10-CM

## 2024-08-31 DIAGNOSIS — I2699 Other pulmonary embolism without acute cor pulmonale: Secondary | ICD-10-CM | POA: Diagnosis not present

## 2024-08-31 DIAGNOSIS — C641 Malignant neoplasm of right kidney, except renal pelvis: Secondary | ICD-10-CM | POA: Diagnosis not present

## 2024-08-31 LAB — CBC
HCT: 38.8 % — ABNORMAL LOW (ref 39.0–52.0)
Hemoglobin: 13.2 g/dL (ref 13.0–17.0)
MCH: 29.7 pg (ref 26.0–34.0)
MCHC: 34 g/dL (ref 30.0–36.0)
MCV: 87.2 fL (ref 80.0–100.0)
Platelets: 277 K/uL (ref 150–400)
RBC: 4.45 MIL/uL (ref 4.22–5.81)
RDW: 11.9 % (ref 11.5–15.5)
WBC: 8.2 K/uL (ref 4.0–10.5)
nRBC: 0 % (ref 0.0–0.2)

## 2024-08-31 LAB — URINE CULTURE: Culture: NO GROWTH

## 2024-08-31 LAB — CBG MONITORING, ED
Glucose-Capillary: 128 mg/dL — ABNORMAL HIGH (ref 70–99)
Glucose-Capillary: 110 mg/dL — ABNORMAL HIGH (ref 70–99)

## 2024-08-31 LAB — HIV ANTIBODY (ROUTINE TESTING W REFLEX): HIV Screen 4th Generation wRfx: NONREACTIVE

## 2024-08-31 LAB — HEMOGLOBIN A1C
Hgb A1c MFr Bld: 6.4 % — ABNORMAL HIGH (ref 4.8–5.6)
Mean Plasma Glucose: 136.98 mg/dL

## 2024-08-31 LAB — ECHOCARDIOGRAM COMPLETE
AR max vel: 2.21 cm2
AV Area VTI: 2.48 cm2
AV Area mean vel: 2.24 cm2
AV Mean grad: 5 mmHg
AV Peak grad: 10.5 mmHg
Ao pk vel: 1.62 m/s
Area-P 1/2: 3.46 cm2
Height: 70 in
MV VTI: 2.51 cm2
S' Lateral: 3 cm
Weight: 4032 [oz_av]

## 2024-08-31 LAB — GLUCOSE, CAPILLARY
Glucose-Capillary: 109 mg/dL — ABNORMAL HIGH (ref 70–99)
Glucose-Capillary: 130 mg/dL — ABNORMAL HIGH (ref 70–99)

## 2024-08-31 LAB — HEPARIN LEVEL (UNFRACTIONATED)
Heparin Unfractionated: 0.34 [IU]/mL (ref 0.30–0.70)
Heparin Unfractionated: 0.28 [IU]/mL — ABNORMAL LOW (ref 0.30–0.70)
Heparin Unfractionated: <0.1 [IU]/mL — ABNORMAL LOW (ref 0.30–0.70)

## 2024-08-31 MED ORDER — MIDAZOLAM HCL (PF) 2 MG/2ML IJ SOLN
INTRAMUSCULAR | Status: DC | PRN
  Administered 2024-08-31: 2 mg via INTRAVENOUS

## 2024-08-31 MED ORDER — FENTANYL CITRATE (PF) 100 MCG/2ML IJ SOLN
INTRAMUSCULAR | Status: AC
  Filled 2024-08-31: qty 2

## 2024-08-31 MED ORDER — MIDAZOLAM HCL 2 MG/2ML IJ SOLN
INTRAMUSCULAR | Status: AC
  Filled 2024-08-31: qty 4

## 2024-08-31 MED ORDER — CEFAZOLIN SODIUM-DEXTROSE 2-4 GM/100ML-% IV SOLN
INTRAVENOUS | Status: AC
  Filled 2024-08-31: qty 100

## 2024-08-31 MED ORDER — HEPARIN SODIUM (PORCINE) 1000 UNIT/ML IJ SOLN
INTRAMUSCULAR | Status: AC
  Filled 2024-08-31: qty 10

## 2024-08-31 MED ORDER — HEPARIN BOLUS VIA INFUSION
1500.0000 [IU] | Freq: Once | INTRAVENOUS | Status: AC
  Administered 2024-08-31: 1500 [IU] via INTRAVENOUS
  Filled 2024-08-31: qty 1500

## 2024-08-31 MED ORDER — IODIXANOL 320 MG/ML IV SOLN
INTRAVENOUS | Status: DC | PRN
  Administered 2024-08-31: 15 mL via INTRAVENOUS

## 2024-08-31 MED ORDER — LIDOCAINE-EPINEPHRINE (PF) 1 %-1:200000 IJ SOLN
INTRAMUSCULAR | Status: DC | PRN
  Administered 2024-08-31: 10 mL via INTRADERMAL

## 2024-08-31 MED ORDER — SODIUM CHLORIDE 0.9 % IV SOLN
INTRAVENOUS | Status: DC
  Filled 2024-08-31: qty 150

## 2024-08-31 MED ORDER — CEFAZOLIN SODIUM-DEXTROSE 2-4 GM/100ML-% IV SOLN
2.0000 g | INTRAVENOUS | Status: DC
  Filled 2024-08-31: qty 100

## 2024-08-31 MED ORDER — APIXABAN (ELIQUIS) VTE STARTER PACK (10MG AND 5MG)
ORAL_TABLET | ORAL | 0 refills | Status: AC
  Filled 2024-08-31: qty 74, 30d supply, fill #0

## 2024-08-31 MED ORDER — HEPARIN (PORCINE) IN NACL 1000-0.9 UT/500ML-% IV SOLN
INTRAVENOUS | Status: DC | PRN
  Administered 2024-08-31: 500 mL

## 2024-08-31 NOTE — TOC CM/SW Note (Signed)
 Transition of Care [facility]) - Inpatient Brief Assessment   Patient Details  Name: Maxwell Davis MRN: [id number] Date of Birth: 04/21/1966  Transition of Care [facility]) CM/SW Contact:    Corean ONEIDA Haddock, RN Phone Number: 08/31/2024, 5:14 PM   Clinical Narrative:   Transition of Care Department [facility]) has reviewed patient and no TOC needs have been identified at this time.  If new patient transition needs arise, please place a TOC consult.  Transition of Care Asessment: Insurance and Status: Insurance coverage has been reviewed Patient has primary care physician: Yes     Prior/Current Home Services: No current home services Social Drivers of Health Review: SDOH reviewed no interventions necessary Readmission risk has been reviewed: Yes Transition of care needs: no transition of care needs at this time

## 2024-08-31 NOTE — Progress Notes (Signed)
 " PROGRESS NOTE    Maxwell Davis  FMW:[id number] DOB: 03-31-1966 DOA: 08/30/2024 PCP: Maxwell Clotilda DEL, NP  Chief Complaint  Patient presents with   Abnormal CT    Hospital Course:  Maxwell Davis is 59 year old male with recently diagnosed right-sided renal cell carcinoma, hypertension, diabetes.  He was recently seen at the urology office for evaluation of his renal cell carcinoma and was incidentally found to have bilateral PE on the CT study.  He was sent to the ED.  On arrival patient denies any shortness of breath or chest pain.  ED workup shows hemodynamic stability, O2 sat 95% on room air, no tachycardia.  Chest CT confirms bilateral PE with some evidence of right heart strain on CT.  He was admitted for further workup.  Subjective: Patient remains very comfortable this morning.  Denies any acute complaints.  He is mostly concerned about when he will be able to go back to work.   Objective: Vitals:   08/31/24 0900 08/31/24 0946 08/31/24 1310 08/31/24 1539  BP:  (!) 140/79 123/60 (!) 142/75  Pulse: 72 79 80 70  Resp: 19 17 (!) 22 16  Temp:  (!) 97.4 F (36.3 C) 97.8 F (36.6 C) 98 F (36.7 C)  TempSrc:  Oral Oral   SpO2: 98% 99% 98% 95%    Intake/Output Summary (Last 24 hours) at 08/31/2024 1703 Last data filed at 08/31/2024 0753 Gross per 24 hour  Intake 284.48 ml  Output --  Net 284.48 ml   There were no vitals filed for this visit.  Examination: General exam: Appears calm and comfortable, NAD  Respiratory system: No work of breathing, symmetric chest wall expansion Cardiovascular system: S1 & S2 heard, RRR.  Gastrointestinal system: Abdomen is nondistended, soft and nontender.  Neuro: Alert and oriented. No focal neurological deficits. Extremities: Symmetric, expected ROM  Assessment & Plan:  Principal Problem:   PE (pulmonary thromboembolism) (HCC) Active Problems:   Acute pulmonary embolism (HCC)   DVT (deep venous thrombosis) (HCC)    Bilateral  PE Right leg DVT - Likely provoked due to hypercoagulable state secondary to renal carcinoma - Vascular surgery was consulted for possible thrombectomy, but no evidence of right heart strain on echocardiogram and thus thrombectomy not needed at this time - There was briefly concern of thrombus in the right renal vein or IVC but this was better evaluated on MRI and there is no evidence of such thrombus. - Patient was started on heparin  drip, will discontinue this and initiate with Eliquis  once IVC filter was placed. - Will transition him to Eliquis  outpatient, have confirmed with pharmacy that this is affordable for patient at $10 co-pay - Patient is planning to undergo nephrectomy and will require brief period of pausing anticoagulation prior to this procedure.  Patient will be a high thrombosis risk perioperatively within the first 3 months. - IVC filter being placed today with vascular surgery,  - For now we will plan on full anticoagulation for 2 to 4 weeks and urology will reassess surgical plans.  Patient will likely require heparin  bridge to decrease interval of anticoagulation interruption - Hematology, Maxwell Davis, also consulted and agrees with this plan.  Gross hematuria Right renal cell carcinoma - Working up outpatient with urology and oncology.  Planning for right radical nephrectomy - Surgical timing will be based on his anticoagulation.  Will need at least 3 weeks of full dose anticoagulation before consideration of pausing AC.  Diffuse hepatic steatosis - Incidentally seen on  CT.  Outpatient follow-up with PCP for risk stratification and further management and monitoring  OSA - CPAP at night  Hyperlipidemia -Does not appear to be taking statin outpatient  History of PSVT - Aware.  No concerns at this time  Seizure disorder - No longer taking AEDs  Hypertension - Blood pressure currently stable.  Continue home meds  Obesity BMI 36 - Outpatient follow up for lifestyle  modification and risk factor management  Diabetes - Hemoglobin A1c 6.4% - Metformin at home  DVT prophylaxis: Heparin    Code Status: Full Code Disposition: Likely DC in a.m.  Consultants:  Treatment Team:  Consulting Physician: Maxwell Davis  Procedures:    Antimicrobials:  Anti-infectives (From admission, onward)    Start     Dose/Rate Route Frequency Ordered Stop   08/30/24 1400  ceFAZolin  (ANCEF ) IVPB 2g/100 mL premix  Status:  Discontinued        2 g 200 mL/hr over 30 Minutes Intravenous 30 min pre-op 08/30/24 1400 08/31/24 1643       Data Reviewed: I have personally reviewed following labs and imaging studies CBC: Recent Labs  Lab 08/30/24 1311 08/30/24 1411 08/31/24 0136  WBC 7.4 7.0 8.2  HGB 13.9 13.8 13.2  HCT 40.3 40.4 38.8*  MCV 87.0 86.7 87.2  PLT 278 260 277   Basic Metabolic Panel: Recent Labs  Lab 08/30/24 1311 08/30/24 1411  NA 136 137  K 3.8 3.9  CL 100 102  CO2 23 25  GLUCOSE 177* 141*  BUN 13 12  CREATININE 0.84 0.83  CALCIUM  9.6 9.2   GFR: Estimated Creatinine Clearance: 121.3 mL/min (by C-G formula based on SCr of 0.83 mg/dL). Liver Function Tests: No results for input(s): AST, ALT, ALKPHOS, BILITOT, PROT, ALBUMIN in the last 168 hours. CBG: Recent Labs  Lab 08/30/24 1807 08/31/24 0721 08/31/24 1124 08/31/24 1600  GLUCAP 87 128* 110* 109*    No results found for this or any previous visit (from the past 240 hours).   Radiology Studies: ECHOCARDIOGRAM COMPLETE Result Date: 08/31/2024    ECHOCARDIOGRAM REPORT   Patient Name:   Maxwell Davis Date of Exam: 08/31/2024 Medical Rec #:  [id number]      Height:       70.0 in Accession #:    [id number]     Weight:       252.0 lb Date of Birth:  23-Jan-1966      BSA:          2.303 m Patient Age:    58 years       BP:           134/74 mmHg Patient Gender: M              HR:           58 bpm. Exam Location:  [facility] Procedure: 2D Echo, Cardiac Doppler and Color Doppler  (Both Spectral and Color            Flow Doppler were utilized during procedure). Indications:     Pulmonary embolus I26.09  History:         Patient has prior history of Echocardiogram examinations, most                  recent 08/26/2017. Risk Factors:Hypertension and Dyslipidemia.  Sonographer:     Christopher Furnace Referring Phys:  [id number] CORT ONEIDA MANA Diagnosing Phys: Evalene Lunger Davis IMPRESSIONS  1. Left ventricular ejection fraction, by estimation, is 55  to 60%. The left ventricle has normal function. The left ventricle has no regional wall motion abnormalities. Left ventricular diastolic parameters are consistent with Grade I diastolic dysfunction (impaired relaxation).  2. Right ventricular systolic function is normal. The right ventricular size is mildly enlarged.  3. The mitral valve is normal in structure. No evidence of mitral valve regurgitation. No evidence of mitral stenosis.  4. The aortic valve is tricuspid. Aortic valve regurgitation is not visualized. No aortic stenosis is present.  5. The inferior vena cava is normal in size with greater than 50% respiratory variability, suggesting right atrial pressure of 3 mmHg. FINDINGS  Left Ventricle: Left ventricular ejection fraction, by estimation, is 55 to 60%. The left ventricle has normal function. The left ventricle has no regional wall motion abnormalities. Strain was performed and the global longitudinal strain is indeterminate. The left ventricular internal cavity size was normal in size. There is no left ventricular hypertrophy. Left ventricular diastolic parameters are consistent with Grade I diastolic dysfunction (impaired relaxation). Right Ventricle: The right ventricular size is mildly enlarged. No increase in right ventricular wall thickness. Right ventricular systolic function is normal. Left Atrium: Left atrial size was normal in size. Right Atrium: Right atrial size was normal in size. Pericardium: There is no evidence of pericardial effusion.  Mitral Valve: The mitral valve is normal in structure. No evidence of mitral valve regurgitation. No evidence of mitral valve stenosis. MV peak gradient, 3.6 mmHg. The mean mitral valve gradient is 2.0 mmHg. Tricuspid Valve: The tricuspid valve is normal in structure. Tricuspid valve regurgitation is mild . No evidence of tricuspid stenosis. Aortic Valve: The aortic valve is tricuspid. Aortic valve regurgitation is not visualized. No aortic stenosis is present. Aortic valve mean gradient measures 5.0 mmHg. Aortic valve peak gradient measures 10.5 mmHg. Aortic valve area, by VTI measures 2.48  cm. Pulmonic Valve: The pulmonic valve was normal in structure. Pulmonic valve regurgitation is not visualized. No evidence of pulmonic stenosis. Aorta: The aortic root is normal in size and structure. Venous: The inferior vena cava is normal in size with greater than 50% respiratory variability, suggesting right atrial pressure of 3 mmHg. IAS/Shunts: No atrial level shunt detected by color flow Doppler. Additional Comments: 3D was performed not requiring image post processing on an independent workstation and was indeterminate.  LEFT VENTRICLE PLAX 2D LVIDd:         4.80 cm   Diastology LVIDs:         3.00 cm   LV e' medial:    6.96 cm/s LV PW:         1.30 cm   LV E/e' medial:  9.3 LV IVS:        1.00 cm   LV e' lateral:   10.70 cm/s LVOT diam:     2.20 cm   LV E/e' lateral: 6.1 LV SV:         73 LV SV Index:   32 LVOT Area:     3.80 cm LV IVRT:       111 msec  RIGHT VENTRICLE RV Basal diam:  3.60 cm RV Mid diam:    3.50 cm LEFT ATRIUM             Index        RIGHT ATRIUM           Index LA diam:        2.50 cm 1.09 cm/m   RA Area:     16.80 cm  LA Vol (A2C):   50.8 ml 22.06 ml/m  RA Volume:   43.10 ml  18.72 ml/m LA Vol (A4C):   26.8 ml 11.64 ml/m LA Biplane Vol: 38.2 ml 16.59 ml/m  AORTIC VALVE AV Area (Vmax):    2.21 cm AV Area (Vmean):   2.24 cm AV Area (VTI):     2.48 cm AV Vmax:           162.00 cm/s AV  Vmean:          106.000 cm/s AV VTI:            0.293 m AV Peak Grad:      10.5 mmHg AV Mean Grad:      5.0 mmHg LVOT Vmax:         94.30 cm/s LVOT Vmean:        62.400 cm/s LVOT VTI:          0.191 m LVOT/AV VTI ratio: 0.65  AORTA Ao Root diam: 3.40 cm MITRAL VALVE               TRICUSPID VALVE MV Area (PHT): 3.46 cm    TR Peak grad:   5.7 mmHg MV Area VTI:   2.51 cm    TR Vmax:        119.00 cm/s MV Peak grad:  3.6 mmHg MV Mean grad:  2.0 mmHg    SHUNTS MV Vmax:       0.95 m/s    Systemic VTI:  0.19 m MV Vmean:      63.2 cm/s   Systemic Diam: 2.20 cm MV Decel Time: 219 msec MV E velocity: 65.00 cm/s MV A velocity: 81.80 cm/s MV E/A ratio:  0.79 Evalene Lunger Davis Electronically signed by Evalene Lunger Davis Signature Date/Time: 08/31/2024/1:34:00 PM    Final (Updated)    US  Venous Img Lower Bilateral (DVT) Result Date: 08/31/2024 EXAM: ULTRASOUND DUPLEX OF THE BILATERAL LOWER EXTREMITY VEINS TECHNIQUE: Duplex ultrasound using B-mode/gray scaled imaging and Doppler spectral analysis and color flow was obtained of the deep venous structures of the bilateral lower extremity. COMPARISON: None available. CLINICAL HISTORY: Pulmonary embolism (HCC). FINDINGS: LEFT: The common femoral vein, femoral vein, popliteal vein, and posterior tibial vein demonstrate normal compressibility with normal color flow and spectral analysis. RIGHT: Occlusive thrombus is present within a posterior tibial vein in the right calf. The common femoral vein, femoral vein, and popliteal vein demonstrate normal compressibility with normal color flow and spectral analysis. IMPRESSION: 1. Occlusive thrombus within a posterior tibial vein in the right calf. 2. No evidence of left DVT. Electronically signed by: Pinkie Pebbles Davis 08/31/2024 02:45 AM EST RP Workstation: [id number]   MR ABDOMEN W WO CONTRAST Result Date: 08/30/2024 EXAM: MRI Abdomen with and without Contrast 08/30/2024 05:04:16 PM TECHNIQUE: Multiplanar multisequence MRI of the  abdomen was performed with and without the administration of 10 mL gadobutrol  (GADAVIST ) 1 MMOL/ML injection. COMPARISON: CT scans 08/30/2024 and 08/20/2024. CLINICAL HISTORY: Acute pulmonary embolus, right renal tumor, assessment for venous clot. FINDINGS: LIVER: Mild diffuse hepatic steatosis. GALLBLADDER AND BILIARY SYSTEM: Gallbladder is unremarkable. No intrahepatic or extrahepatic ductal dilation. SPLEEN: Unremarkable. PANCREAS: Unremarkable. ADRENAL GLANDS: Unremarkable. KIDNEYS: Partially exophytic right mid kidney mass laterally measuring 5.6 x 6.6 cm on image 49 of series 18. This slightly abuts the capsular margin of the liver on image 25 series 20. Additional anterior right renal mass 2.9 x 3.3 cm on image 46 of series 18. No well defined thrombus in the right renal  vein or inferior vena cava (IVC). Punctate appearing parapelvic renal cyst on the left greater than right. LYMPH NODES: No pathologic adenopathy in the upper abdomen. VASCULATURE: Patent portal vein. The superior mesenteric vein is patent. PERITONEUM: No ascites. BOWEL: Chronic stranding in the central mesentery, cannot exclude mild sclerosing mesenteritis. This was also present on 02/28/2020. ABDOMINAL WALL: No acute abnormality. BONES: Degenerative endplate findings at L2-L3, L3-L4, and L4-L5. IMPRESSION: 1. No well-defined thrombus in the right renal vein or inferior vena cava. 2. Partially exophytic right mid kidney mass favoring renal cell carcinoma measuring 5.6 x 6.6 cm, slightly abutting the capsular margin of the liver. 3. Additional anterior right renal mass favoring renal cell carcinoma measuring 2.9 x 3.3 cm. 4. Chronic central mesenteric stranding, with mild sclerosing mesenteritis not excluded. 5. Degenerative endplate changes at L2-3, L3-4, and L4-5. 6. Mild diffuse hepatic steatosis. Electronically signed by: Ryan Salvage Davis 08/30/2024 05:22 PM EST RP Workstation: [id number]   CT HEAD WO CONTRAST ( ) Result Date:  08/30/2024 CLINICAL DATA:  Head CT of the chest done this a.m., blood clots present and was told to come to ER. EXAM: CT HEAD WITHOUT CONTRAST TECHNIQUE: Contiguous axial images were obtained from the base of the skull through the vertex without intravenous contrast. RADIATION DOSE REDUCTION: This exam was performed according to the departmental dose-optimization program which includes automated exposure control, adjustment of the mA and/or kV according to patient size and/or use of iterative reconstruction technique. COMPARISON:  None Available. FINDINGS: Brain: No evidence of acute infarction, hemorrhage, hydrocephalus, extra-axial collection or mass lesion/mass effect. Vascular: No hyperdense vessel or unexpected calcification. Skull: Normal. Negative for fracture or focal lesion. Sinuses/Orbits: No acute finding. Other: None. IMPRESSION: No acute intracranial pathology. Electronically Signed   By: Suzen Dials M.D.   On: 08/30/2024 14:25   CT Chest W Contrast Result Date: 08/30/2024 CLINICAL DATA:  Right renal mass, staging workup. * Tracking Code: BO * EXAM: CT CHEST WITH CONTRAST TECHNIQUE: Multidetector CT imaging of the chest was performed during intravenous contrast administration. RADIATION DOSE REDUCTION: This exam was performed according to the departmental dose-optimization program which includes automated exposure control, adjustment of the mA and/or kV according to patient size and/or use of iterative reconstruction technique. CONTRAST:  75mL OMNIPAQUE  IOHEXOL  300 MG/ML  SOLN COMPARISON:  CT abdomen 08/20/2024 and CT chest 02/18/2022. FINDINGS: Cardiovascular: Although the exam was not optimized for the detection of pulmonary emboli, there are bilateral pulmonary arterial filling defects with the most proximal clot seen at distal lobar levels in the right lower lobe and left upper lobe. RV/LV ratio 1.0. Heart is mildly enlarged. No pericardial effusion. Mediastinum/Nodes: No pathologically  enlarged mediastinal, hilar or axillary lymph nodes. Esophagus is grossly unremarkable. Lungs/Pleura: Calcified granulomas. A few scattered pulmonary nodules measure up to 4 mm in the anterolateral left lower lobe (3/102), unchanged and benign. Minimal subpleural ground-glass in the medial left upper lobe (3/40), possibly due to postinflammatory scarring. No pleural fluid. Airway is unremarkable. Upper Abdomen: Liver appears mildly enlarged but is incompletely imaged. 3.4 x 3.6 cm heterogeneous mass in the anterior interpolar right kidney (2/164). Partially imaged second heterogeneous mass in the interpolar right kidney measures approximately 6.0 x 6.1 cm (2/170). Portacaval lymph node measures 1.6 cm, as before. Small gastrohepatic ligament lymph nodes. Visualized portions of the liver, gallbladder, adrenal glands, kidneys, spleen, pancreas, stomach and bowel are otherwise grossly unremarkable. No upper abdominal adenopathy. Musculoskeletal: Degenerative changes in the spine. No worrisome lytic or sclerotic lesions. IMPRESSION: 1.  Bilateral pulmonary emboli with the most proximal clot seen at the distal lobar level bilaterally. Positive for acute PE with CT evidence of right heart strain (RV/LV Ratio = 1.0) consistent with at least submassive (intermediate risk) PE. The presence of right heart strain has been associated with an increased risk of morbidity and mortality. Please refer to the Code PE Focused order set in EPIC. Critical Value/emergent results were called by telephone at the time of interpretation on 08/30/2024 at 11:40 am to provider HEATER CARMEN, RN , who verbally acknowledged these results. 2. Right renal masses, compatible with renal cell carcinomas. No evidence of metastatic disease. Electronically Signed   By: Newell Eke M.D.   On: 08/30/2024 11:40    Scheduled Meds:  aspirin  EC  81 mg Oral Daily   chlorhexidine   60 mL Topical Once   And   chlorhexidine   60 mL Topical Once   insulin   aspart  0-15 Units Subcutaneous TID WC   polyethylene glycol  17 g Oral Daily   Continuous Infusions:  heparin  1,800 Units/hr (08/31/24 1112)     LOS: 1 day  MDM: Patient is high risk for one or more organ failure.  They necessitate ongoing hospitalization for continued IV therapies and subsequent lab monitoring. Total time spent interpreting labs and vitals, reviewing the medical record, coordinating care amongst consultants and care team members, directly assessing and discussing care with the patient and/or family: 55 min  Valeda Corzine, DO [facility]  To contact the attending physician between 7A-7P please use Epic Chat. To contact the covering physician during after hours 7P-7A, please review Amion.  08/31/2024, 5:03 PM   *This document has been created with the assistance of dictation software. Please excuse typographical errors. *   "

## 2024-08-31 NOTE — Interval H&P Note (Signed)
 History and Physical Interval Note:  08/31/2024 5:12 PM  [name]  has presented today for surgery, with the diagnosis of DVT.  The various methods of treatment have been discussed with the patient and family. After consideration of risks, benefits and other options for treatment, the patient has consented to  Procedures: IVC FILTER INSERTION (N/A) as a surgical intervention.  The patient's history has been reviewed, patient examined, no change in status, stable for surgery.  I have reviewed the patient's chart and labs.  Questions were answered to the patient's satisfaction.     Maxwell Davis

## 2024-08-31 NOTE — Consult Note (Signed)
 PHARMACY - ANTICOAGULATION CONSULT NOTE  Pharmacy Consult for heparin  dosing Indication: pulmonary embolus  Allergies[1]  Patient Measurements: Height: 5' 10 (177.8 cm) Weight: 114 kg (251 lb 5.2 oz) IBW/kg (Calculated) : 73 HEPARIN  DW (KG): 98.1  Vital Signs: Temp: 97.6 F (36.4 C) (01/22 1710) Temp Source: Oral (01/22 1710) BP: 112/70 (01/22 1738) Pulse Rate: 69 (01/22 1738)  Labs: Recent Labs    08/30/24 1311 08/30/24 1411 08/31/24 0136 08/31/24 0942 08/31/24 1818  HGB 13.9 13.8 13.2  --   --   HCT 40.3 40.4 38.8*  --   --   PLT 278 260 277  --   --   APTT  --  29  --   --   --   LABPROT  --  14.1  --   --   --   INR  --  1.0  --   --   --   HEPARINUNFRC  --   --  0.34 0.28* <0.10*  CREATININE 0.84 0.83  --   --   --     Estimated Creatinine Clearance: 121.2 mL/min (by C-G formula based on SCr of 0.83 mg/dL).   Medical History: Past Medical History:  Diagnosis Date   Essential hypertension    Headache    Hepatic steatosis    History of PSVT (paroxysmal supraventricular tachycardia)    Hyperlipidemia    Kidney stone    Morbid obesity (HCC)    OSA (obstructive sleep apnea)    Pre-diabetes    Seizures (HCC)    a. On dilantin .  Last seizure 1995.   Vasovagal syncope     Medications:  No PTA anticoagulation  Assessment: Maxwell Davis is a 89 yoM presenting with concerns for pulmonary thromboembolism. Past medical history is notable for obesity (BMI 36), sleep apnea, and history of known renal mass. Patient arrived to ED due to concerns for bilateral PE at outpatient imaging appointment.   1/21 CT Chest:  Bilateral pulmonary emboli with the most proximal clot seen at the distal lobar level bilaterally. Positive for acute PE with CT evidence of right heart strain (RV/LV Ratio = 1.0) consistent with at least submassive (intermediate risk) PE  Baseline Hgb 13.8, plt 260, aPTT 29, and INR 1.0.  1/22 Hgb and plt stable  Goal of Therapy:  Heparin  level  0.3-0.7 units/ml Monitor platelets by anticoagulation protocol: Yes  01/22 0136 HL 0.34, therapeutic x 1 01/22 0942 HL 0.28, SUBtherapeutic @ 1600 units/hr 01/22 1818 HL <0.10, subtherapeutic @ 1800 units/hr, heparin  infusion was stopped and 1548 and resumed at 1757   Plan:  HL subtherapeutic, however level was drawn shortly after infusion resumed post IVC placement Given recent procedure, will continue heparin  infusion at 1800 units/hr Check anti-Xa level in 6 hours Continue to monitor H&H and platelets  Maxwell Davis, PharmD Clinical Pharmacist 08/31/2024 7:24 PM      [1]  Allergies Allergen Reactions   Levetiracetam Other (See Comments)    Irritable, mood swings   Penicillins Rash    Has patient had a PCN reaction causing immediate rash, facial/tongue/throat swelling, SOB or lightheadedness with hypotension: Unknown Has patient had a PCN reaction causing severe rash involving mucus membranes or skin necrosis: Unknown Has patient had a PCN reaction that required hospitalization: No Has patient had a PCN reaction occurring within the last 10 years: No If all of the above answers are NO, then may proceed with Cephalosporin use.

## 2024-08-31 NOTE — Consult Note (Signed)
 PHARMACY - ANTICOAGULATION CONSULT NOTE  Pharmacy Consult for heparin  dosing Indication: pulmonary embolus  Allergies[1]  Patient Measurements:    Vital Signs: Temp: 97.9 F (36.6 C) (01/21 2254) Temp Source: Oral (01/21 2254) BP: 126/83 (01/21 2100) Pulse Rate: 74 (01/21 2100)  Labs: Recent Labs    08/30/24 1311 08/30/24 1411 08/31/24 0136  HGB 13.9 13.8 13.2  HCT 40.3 40.4 38.8*  PLT 278 260 277  APTT  --  29  --   LABPROT  --  14.1  --   INR  --  1.0  --   HEPARINUNFRC  --   --  0.34  CREATININE 0.84 0.83  --     Estimated Creatinine Clearance: 121.3 mL/min (by C-G formula based on SCr of 0.83 mg/dL).   Medical History: Past Medical History:  Diagnosis Date   Essential hypertension    Headache    Hepatic steatosis    History of PSVT (paroxysmal supraventricular tachycardia)    Hyperlipidemia    Kidney stone    Morbid obesity (HCC)    OSA (obstructive sleep apnea)    Pre-diabetes    Seizures (HCC)    a. On dilantin .  Last seizure 1995.   Vasovagal syncope     Medications:  No PTA anticoagulation  Assessment: Maxwell Davis is a 13 yoM presenting with concerns for pulmonary thromboembolism. Past medical history is notable for obesity (BMI 36), sleep apnea, and history of known renal mass. Patient arrived to ED due to concerns for bilateral PE at outpatient imaging appointment.   1/21 CT Chest:  Bilateral pulmonary emboli with the most proximal clot seen at the distal lobar level bilaterally. Positive for acute PE with CT evidence of right heart strain (RV/LV Ratio = 1.0) consistent with at least submassive (intermediate risk) PE  Baseline Hgb 13.8, plt 260, aPTT 29, and INR 1.0.  Goal of Therapy:  Heparin  level 0.3-0.7 units/ml Monitor platelets by anticoagulation protocol: Yes  01/22 0136 HL 0.34, therapeutic x 1   Plan:  Continue heparin  infusion at 1600 units/hr Recheck anti-Xa level in 6 hours to confirm Continue to monitor H&H and  platelets  Maxwell Davis, PharmD, [facility] 08/31/2024 2:41 AM        [1]  Allergies Allergen Reactions   Levetiracetam Other (See Comments)    Irritable, mood swings   Penicillins Rash    Has patient had a PCN reaction causing immediate rash, facial/tongue/throat swelling, SOB or lightheadedness with hypotension: Unknown Has patient had a PCN reaction causing severe rash involving mucus membranes or skin necrosis: Unknown Has patient had a PCN reaction that required hospitalization: No Has patient had a PCN reaction occurring within the last 10 years: No If all of the above answers are NO, then may proceed with Cephalosporin use.

## 2024-08-31 NOTE — ED Notes (Signed)
Patient resting in bed free from sign of distress. Breathing unlabored speaking in full sentences with symmetric chest rise and fall. Bed low and locked with side rails raised x2. Call bell in reach and monitor in place.   

## 2024-08-31 NOTE — Telephone Encounter (Signed)
 Pre-op team,   Please let requesting office know patient is currently hospitalized for bilateral PE. I am unsure how this will impact upcoming surgery. They will likely need to resend request when patient's current medical condition is further understood.   Thank you!  Barnie HERO. Keyion Knack, DNP, NP-C  08/31/2024, 10:41 AM [facility] [address] Office [phone] Fax [phone]

## 2024-08-31 NOTE — Op Note (Signed)
  VEIN AND VASCULAR SURGERY   OPERATIVE NOTE    PRE-OPERATIVE DIAGNOSIS: DVT, PE, renal cell cancer with hematuria  POST-OPERATIVE DIAGNOSIS: same as above  PROCEDURE: 1.   Ultrasound guidance for vascular access to the right femoral vein 2.   Catheter placement into the inferior vena cava 3.   Inferior venacavogram 4.   Placement of a Option Elite IVC filter  SURGEON: Selinda Gu, MD  ASSISTANT(S): None  ANESTHESIA: local with Moderate Conscious Sedation for approximately 11 minutes using 2 mg of Versed   ESTIMATED BLOOD LOSS: minimal  CONTRAST: 15 cc  FLUORO TIME: less than one minute  FINDING(S): 1.  Patent IVC  SPECIMEN(S):  none  INDICATIONS:   Maxwell Davis is a 59 y.o. male who presents with hematuria from his renal cell carcinoma with a PE as well as a small residual DVT.  Inferior vena cava filter is indicated for this reason.  Risks and benefits including filter thrombosis, migration, fracture, bleeding, and infection were all discussed.  We discussed that all IVC filters that we place can be removed if desired from the patient once the need for the filter has passed.    DESCRIPTION: After obtaining full informed written consent, the patient was brought back to the vascular suite. The skin was sterilely prepped and draped in a sterile surgical field was created. Moderate conscious sedation was administered during a face to face encounter with the patient throughout the procedure with my supervision of the RN administering medicines and monitoring the patient's vital signs, pulse oximetry, telemetry and mental status throughout from the start of the procedure until the patient was taken to the recovery room. The right femoral vein was accessed under direct ultrasound guidance without difficulty with a Seldinger needle and a J-wire was then placed. After skin nick and dilatation, the delivery sheath was placed into the inferior vena cava and an inferior venacavogram  was performed. This demonstrated a patent IVC with the level of the renal veins at L1.  Specifically, no tail of thrombus was seen in the suprarenal vena cava from his renal cell carcinoma.  The filter was then deployed into the inferior vena cava at the level of L2 just below the renal veins. The delivery sheath was then removed. Pressure was held. Sterile dressings were placed. The patient tolerated the procedure well and was taken to the recovery room in stable condition.  COMPLICATIONS: None  CONDITION: Stable  Selinda Gu  08/31/2024, 5:41 PM   This note was created with Dragon Medical transcription system. Any errors in dictation are purely unintentional.

## 2024-08-31 NOTE — Telephone Encounter (Signed)
 I will send as FYI to preop APP to review notes from [facility], CMA.

## 2024-08-31 NOTE — Progress Notes (Signed)
*  PRELIMINARY RESULTS* Echocardiogram 2D Echocardiogram has been performed.  Floydene Harder 08/31/2024, 9:00 AM

## 2024-08-31 NOTE — Consult Note (Addendum)
 PHARMACY - ANTICOAGULATION CONSULT NOTE  Pharmacy Consult for heparin  dosing Indication: pulmonary embolus  Allergies[1]  Patient Measurements:    Vital Signs: Temp: 97.4 F (36.3 C) (01/22 0946) Temp Source: Oral (01/22 0946) BP: 140/79 (01/22 0946) Pulse Rate: 79 (01/22 0946)  Labs: Recent Labs    08/30/24 1311 08/30/24 1411 08/31/24 0136 08/31/24 0942  HGB 13.9 13.8 13.2  --   HCT 40.3 40.4 38.8*  --   PLT 278 260 277  --   APTT  --  29  --   --   LABPROT  --  14.1  --   --   INR  --  1.0  --   --   HEPARINUNFRC  --   --  0.34 0.28*  CREATININE 0.84 0.83  --   --     Estimated Creatinine Clearance: 121.3 mL/min (by C-G formula based on SCr of 0.83 mg/dL).   Medical History: Past Medical History:  Diagnosis Date   Essential hypertension    Headache    Hepatic steatosis    History of PSVT (paroxysmal supraventricular tachycardia)    Hyperlipidemia    Kidney stone    Morbid obesity (HCC)    OSA (obstructive sleep apnea)    Pre-diabetes    Seizures (HCC)    a. On dilantin .  Last seizure 1995.   Vasovagal syncope     Medications:  No PTA anticoagulation  Assessment: Maxwell Davis is a 81 yoM presenting with concerns for pulmonary thromboembolism. Past medical history is notable for obesity (BMI 36), sleep apnea, and history of known renal mass. Patient arrived to ED due to concerns for bilateral PE at outpatient imaging appointment.   1/21 CT Chest:  Bilateral pulmonary emboli with the most proximal clot seen at the distal lobar level bilaterally. Positive for acute PE with CT evidence of right heart strain (RV/LV Ratio = 1.0) consistent with at least submassive (intermediate risk) PE  Baseline Hgb 13.8, plt 260, aPTT 29, and INR 1.0.  1/22 Hgb and plt stable  Goal of Therapy:  Heparin  level 0.3-0.7 units/ml Monitor platelets by anticoagulation protocol: Yes  01/22 0136 HL 0.34, therapeutic x 1 01/22 0942 HL 0.28, SUBtherapeutic @ 1600 units/hr    Plan:  HL subtherapeutic Give heparin  bolus 1500 units, and increase heparin  infusion to 1800 units/hr Check anti-Xa level in 6 after rate change Continue to monitor H&H and platelets  [name], PharmD Pharmacy Resident  08/31/2024 11:04 AM          [1]  Allergies Allergen Reactions   Levetiracetam Other (See Comments)    Irritable, mood swings   Penicillins Rash    Has patient had a PCN reaction causing immediate rash, facial/tongue/throat swelling, SOB or lightheadedness with hypotension: Unknown Has patient had a PCN reaction causing severe rash involving mucus membranes or skin necrosis: Unknown Has patient had a PCN reaction that required hospitalization: No Has patient had a PCN reaction occurring within the last 10 years: No If all of the above answers are NO, then may proceed with Cephalosporin use.

## 2024-08-31 NOTE — Telephone Encounter (Signed)
"  ° °  Pre-operative Risk Assessment    Patient Name: Maxwell Davis  DOB: 1965-10-17 MRN: [id number]   Date of last office visit: 04/06/2024 with Chantal Needle, NP Date of next office visit: None  Request for Surgical Clearance    Procedure:  Right robotic laparoscopic radical nephrectomy    Date of Surgery:  Clearance 09/12/24                                 Surgeon:  Dr. Penne Skye, MD Surgeon's Group or Practice Name:  [facility] Phone number:  [phone] Fax number:  [phone]   Type of Clearance Requested:   - Medical  - Pharmacy:  Hold Aspirin  -needs to hold but does not specify how for long   Type of Anesthesia:  General    Additional requests/questions:  None  SignedPatrcia Iverson CROME   08/31/2024, 9:44 AM   "

## 2024-08-31 NOTE — ED Notes (Signed)
 NURSE LISA RN INFORMED OF BED ASSIGNED

## 2024-08-31 NOTE — Telephone Encounter (Signed)
 I left a message for Maxwell Davis surgery scheduler per the preop APP the pt is currently admitted to the hospital with b/l PE's.    See notes from preop APP [name], NP. In the mean time I will remove from preop call back until the pt can move forward with his procedure with Dr. Georganne.

## 2024-09-01 ENCOUNTER — Telehealth: Payer: Self-pay

## 2024-09-01 ENCOUNTER — Encounter: Payer: Self-pay | Admitting: Vascular Surgery

## 2024-09-01 DIAGNOSIS — C649 Malignant neoplasm of unspecified kidney, except renal pelvis: Secondary | ICD-10-CM | POA: Diagnosis not present

## 2024-09-01 DIAGNOSIS — I82409 Acute embolism and thrombosis of unspecified deep veins of unspecified lower extremity: Secondary | ICD-10-CM

## 2024-09-01 DIAGNOSIS — N2889 Other specified disorders of kidney and ureter: Secondary | ICD-10-CM | POA: Diagnosis not present

## 2024-09-01 DIAGNOSIS — C641 Malignant neoplasm of right kidney, except renal pelvis: Secondary | ICD-10-CM | POA: Diagnosis not present

## 2024-09-01 DIAGNOSIS — I824Y1 Acute embolism and thrombosis of unspecified deep veins of right proximal lower extremity: Secondary | ICD-10-CM

## 2024-09-01 DIAGNOSIS — I2699 Other pulmonary embolism without acute cor pulmonale: Secondary | ICD-10-CM | POA: Diagnosis not present

## 2024-09-01 LAB — CBC
HCT: 41.7 % (ref 39.0–52.0)
Hemoglobin: 14.1 g/dL (ref 13.0–17.0)
MCH: 29.4 pg (ref 26.0–34.0)
MCHC: 33.8 g/dL (ref 30.0–36.0)
MCV: 86.9 fL (ref 80.0–100.0)
Platelets: 265 K/uL (ref 150–400)
RBC: 4.8 MIL/uL (ref 4.22–5.81)
RDW: 11.8 % (ref 11.5–15.5)
WBC: 6.1 K/uL (ref 4.0–10.5)
nRBC: 0 % (ref 0.0–0.2)

## 2024-09-01 LAB — GLUCOSE, CAPILLARY
Glucose-Capillary: 104 mg/dL — ABNORMAL HIGH (ref 70–99)
Glucose-Capillary: 103 mg/dL — ABNORMAL HIGH (ref 70–99)

## 2024-09-01 LAB — HEPARIN LEVEL (UNFRACTIONATED)
Heparin Unfractionated: 0.35 [IU]/mL (ref 0.30–0.70)
Heparin Unfractionated: 0.35 [IU]/mL (ref 0.30–0.70)

## 2024-09-01 NOTE — Plan of Care (Signed)

## 2024-09-01 NOTE — Consult Note (Signed)
 PHARMACY - ANTICOAGULATION CONSULT NOTE  Pharmacy Consult for heparin  dosing Indication: pulmonary embolus  Allergies[1]  Patient Measurements: Height: 5' 10 (177.8 cm) Weight: 114 kg (251 lb 5.2 oz) IBW/kg (Calculated) : 73 HEPARIN  DW (KG): 98.1  Vital Signs: Temp: 98 F (36.7 C) (01/23 0347) Temp Source: Oral (01/23 0347) BP: 137/79 (01/23 0347) Pulse Rate: 61 (01/23 0347)  Labs: Recent Labs    08/30/24 1311 08/30/24 1411 08/31/24 0136 08/31/24 0942 08/31/24 1818 09/01/24 0120 09/01/24 0808  HGB 13.9 13.8 13.2  --   --   --  14.1  HCT 40.3 40.4 38.8*  --   --   --  41.7  PLT 278 260 277  --   --   --  265  APTT  --  29  --   --   --   --   --   LABPROT  --  14.1  --   --   --   --   --   INR  --  1.0  --   --   --   --   --   HEPARINUNFRC  --   --  0.34   < > <0.10* 0.35 0.35  CREATININE 0.84 0.83  --   --   --   --   --    < > = values in this interval not displayed.    Estimated Creatinine Clearance: 121.2 mL/min (by C-G formula based on SCr of 0.83 mg/dL).   Medical History: Past Medical History:  Diagnosis Date   Essential hypertension    Headache    Hepatic steatosis    History of PSVT (paroxysmal supraventricular tachycardia)    Hyperlipidemia    Kidney stone    Morbid obesity (HCC)    OSA (obstructive sleep apnea)    Pre-diabetes    Seizures (HCC)    a. On dilantin .  Last seizure 1995.   Vasovagal syncope     Medications:  No PTA anticoagulation  Assessment: Maxwell Davis is a 71 yoM presenting with concerns for pulmonary thromboembolism. Past medical history is notable for obesity (BMI 36), sleep apnea, and history of known renal mass. Patient arrived to ED due to concerns for bilateral PE at outpatient imaging appointment.   1/21 CT Chest:  Bilateral pulmonary emboli with the most proximal clot seen at the distal lobar level bilaterally. Positive for acute PE with CT evidence of right heart strain (RV/LV Ratio = 1.0) consistent with at  least submassive (intermediate risk) PE  Baseline Hgb 13.8, plt 260, aPTT 29, and INR 1.0.  1/22 Hgb and plt stable  Goal of Therapy:  Heparin  level 0.3-0.7 units/ml Monitor platelets by anticoagulation protocol: Yes  01/22 0136 HL 0.34, therapeutic x 1 01/22 0942 HL 0.28, SUBtherapeutic @ 1600 units/hr 01/22 1818 HL <0.10, subtherapeutic @ 1800 units/hr, heparin  infusion was stopped and 1548 and resumed at 1757 01/23 0120 HL 0.35, therapeutic x 1 01/23 0808 HL 0.35, therapeutic x 2   Plan:  Continue heparin  infusion at 1800 units/hr Check anti-Xa level with AM labs tomorrow Continue to monitor H&H and platelets  Rankin Maxwell Davis, PharmD, Maxwell Davis 09/01/2024 10:09 AM     [1]  Allergies Allergen Reactions   Levetiracetam Other (See Comments)    Irritable, mood swings   Penicillins Rash    Has patient had a PCN reaction causing immediate rash, facial/tongue/throat swelling, SOB or lightheadedness with hypotension: Unknown Has patient had a PCN reaction causing severe rash involving mucus membranes  or skin necrosis: Unknown Has patient had a PCN reaction that required hospitalization: No Has patient had a PCN reaction occurring within the last 10 years: No If all of the above answers are NO, then may proceed with Cephalosporin use.

## 2024-09-01 NOTE — Progress Notes (Signed)
 Patient discharged , sister is here to pick him up , patient request to be wheelchair down to ICU as his wife is a patient there, his sister will then wheel him to the car, Education for eliquis  was completed, patients IV removed from RFA. Patient verbalized understanding of all follow up appointments,

## 2024-09-01 NOTE — Discharge Summary (Signed)
 " DISCHARGE SUMMARY    Maxwell Davis FMW:[id number] DOB: 1965-12-13 DOA: 08/30/2024  PCP: Steva Clotilda DEL, NP  Admit date: 08/30/2024 Discharge date: 09/01/2024   Recommendations for Outpatient Follow-up:  Follow-up with hematology, recheck hemoglobin Follow-up with vascular surgery in about 2 months to check DVT and discuss filter removal Follow-up with urology for clinic visit, followed by nephrectomy March 3   Hospital Course: Maxwell Davis is 59 year old male with recently diagnosed right-sided renal cell carcinoma, hypertension, diabetes.  He was recently seen at the urology office for evaluation of his renal cell carcinoma and was incidentally found to have bilateral PE on the CT study.  He was sent to the ED.  On arrival patient denies any shortness of breath or chest pain.  ED workup shows hemodynamic stability, O2 sat 95% on room air, no tachycardia.  Chest CT confirms bilateral PE with some evidence of right heart strain on CT.  He was admitted for further workup.   Bilateral PE Right leg DVT - Likely provoked due to hypercoagulable state secondary to renal carcinoma - Vascular surgery was consulted for possible thrombectomy, but no evidence of right heart strain on echocardiogram and thus thrombectomy not needed at this time - There was briefly concern of thrombus in the right renal vein or IVC but this was better evaluated on MRI and there is no evidence of such thrombus. - Patient was started on heparin  drip - IVC placed 1/22 - Has been transitioned to Eliquis  outpatient, have confirmed with pharmacy that this is affordable ~$10 co-pay - Will need uninterrupted anticoagulation for at least 3 weeks.  Urology now planning for nephrectomy first week of March.  Will be high thrombosis risk perioperatively within the first 3 months.  May require heparin  bridge postoperatively. - Outpatient follow-up scheduled with vascular surgery for IVC removal in 2 months - Outpatient  hematology appointment already scheduled - Follow with urology   Gross hematuria Right renal cell carcinoma - Working up outpatient with urology and oncology.  Planning for right radical nephrectomy, tentatively March 3 - Anticoagulation plan as above - Advised patient that he may have worsening hematuria on anticoagulation.  Hemoglobin needs to be monitored closely with heme/onc team, transfuse as needed   Diffuse hepatic steatosis - Incidentally seen on CT.  Outpatient follow-up with PCP for risk stratification and further management and monitoring   OSA - CPAP at night   Hyperlipidemia -Does not appear to be taking statin outpatient   History of PSVT - Aware.  No concerns at this time   Seizure disorder - No longer taking AEDs   Hypertension - Blood pressure currently stable.  Continue home meds   Obesity BMI 36 - Outpatient follow up for lifestyle modification and risk factor management   Diabetes - Hemoglobin A1c 6.4% - Metformin at home  Discharge Instructions  Discharge Instructions     Call MD for:  difficulty breathing, headache or visual disturbances   Complete by: As directed    Call MD for:  persistant dizziness or light-headedness   Complete by: As directed    Call MD for:  persistant nausea and vomiting   Complete by: As directed    Call MD for:  severe uncontrolled pain   Complete by: As directed    Call MD for:  temperature >100.4   Complete by: As directed    Diet general   Complete by: As directed    Discharge instructions   Complete by: As directed  Follow up with Urology, hematology, and Vascular Surgery as scheduled   Please take your blood pressure once a day and keep a log.   For Blood Pressure Monitor home BP readings. Goal: 140/90 or less Bring all BP readings with you to your office visits Bring your home BP cuff with you to your office visits  See your primary care doctor in one week to review this log and make further  medication changes.   Increase activity slowly   Complete by: As directed       Allergies as of 09/01/2024       Reactions   Levetiracetam Other (See Comments)   Irritable, mood swings   Penicillins Rash   Has patient had a PCN reaction causing immediate rash, facial/tongue/throat swelling, SOB or lightheadedness with hypotension: Unknown Has patient had a PCN reaction causing severe rash involving mucus membranes or skin necrosis: Unknown Has patient had a PCN reaction that required hospitalization: No Has patient had a PCN reaction occurring within the last 10 years: No If all of the above answers are NO, then may proceed with Cephalosporin use.        Medication List     STOP taking these medications    Aspirin  81 81 MG tablet Generic drug: aspirin  EC       TAKE these medications    acetaminophen  500 MG tablet Commonly known as: TYLENOL  Take 2 tablets (1,000 mg total) by mouth every 6 (six) hours as needed.   Eliquis  DVT/PE Starter Pack Generic drug: Apixaban  Starter Pack (10mg  and 5mg ) Take as directed on package: start with two-5mg  tablets twice daily for 7 days. On day 8, switch to one-5mg  tablet twice daily.   lisinopril -hydrochlorothiazide  10-12.5 MG tablet Commonly known as: ZESTORETIC  Take 1 tablet by mouth daily. What changed: when to take this   metFORMIN 500 MG 24 hr tablet Commonly known as: GLUCOPHAGE-XR Take 500 mg by mouth daily with supper.   polyethylene glycol 17 g packet Commonly known as: MiraLax  Take 17 g by mouth daily. What changed:  when to take this reasons to take this        Follow-up Information     Dew, Selinda RAMAN, MD Follow up in 2 month(s).   Specialties: Vascular Surgery, Radiology, Interventional Cardiology Why: with DVT study Contact information: [address] [place] [address] [phone]                Allergies[1]  Consultations: Treatment Team:  Francisca Redell BROCKS,  MD   Procedures/Studies: PERIPHERAL VASCULAR CATHETERIZATION Result Date: 08/31/2024 See surgical note for result.  ECHOCARDIOGRAM COMPLETE Result Date: 08/31/2024    ECHOCARDIOGRAM REPORT   Patient Name:   Maxwell Davis Date of Exam: 08/31/2024 Medical Rec #:  [id number]      Height:       70.0 in Accession #:    [id number]     Weight:       252.0 lb Date of Birth:  [birth year]      BSA:          2.303 m Patient Age:    58 years       BP:           134/74 mmHg Patient Gender: M              HR:           58 bpm. Exam Location:  [facility] Procedure: 2D Echo, Cardiac Doppler and Color Doppler (Both  Spectral and Color            Flow Doppler were utilized during procedure). Indications:     Pulmonary embolus I26.09  History:         Patient has prior history of Echocardiogram examinations, most                  recent 08/26/2017. Risk Factors:Hypertension and Dyslipidemia.  Sonographer:     Christopher Furnace Referring Phys:  [id number] CORT ONEIDA MANA Diagnosing Phys: Evalene Lunger MD IMPRESSIONS  1. Left ventricular ejection fraction, by estimation, is 55 to 60%. The left ventricle has normal function. The left ventricle has no regional wall motion abnormalities. Left ventricular diastolic parameters are consistent with Grade I diastolic dysfunction (impaired relaxation).  2. Right ventricular systolic function is normal. The right ventricular size is mildly enlarged.  3. The mitral valve is normal in structure. No evidence of mitral valve regurgitation. No evidence of mitral stenosis.  4. The aortic valve is tricuspid. Aortic valve regurgitation is not visualized. No aortic stenosis is present.  5. The inferior vena cava is normal in size with greater than 50% respiratory variability, suggesting right atrial pressure of 3 mmHg. FINDINGS  Left Ventricle: Left ventricular ejection fraction, by estimation, is 55 to 60%. The left ventricle has normal function. The left ventricle has no regional wall motion abnormalities. Strain was  performed and the global longitudinal strain is indeterminate. The left ventricular internal cavity size was normal in size. There is no left ventricular hypertrophy. Left ventricular diastolic parameters are consistent with Grade I diastolic dysfunction (impaired relaxation). Right Ventricle: The right ventricular size is mildly enlarged. No increase in right ventricular wall thickness. Right ventricular systolic function is normal. Left Atrium: Left atrial size was normal in size. Right Atrium: Right atrial size was normal in size. Pericardium: There is no evidence of pericardial effusion. Mitral Valve: The mitral valve is normal in structure. No evidence of mitral valve regurgitation. No evidence of mitral valve stenosis. MV peak gradient, 3.6 mmHg. The mean mitral valve gradient is 2.0 mmHg. Tricuspid Valve: The tricuspid valve is normal in structure. Tricuspid valve regurgitation is mild . No evidence of tricuspid stenosis. Aortic Valve: The aortic valve is tricuspid. Aortic valve regurgitation is not visualized. No aortic stenosis is present. Aortic valve mean gradient measures 5.0 mmHg. Aortic valve peak gradient measures 10.5 mmHg. Aortic valve area, by VTI measures 2.48  cm. Pulmonic Valve: The pulmonic valve was normal in structure. Pulmonic valve regurgitation is not visualized. No evidence of pulmonic stenosis. Aorta: The aortic root is normal in size and structure. Venous: The inferior vena cava is normal in size with greater than 50% respiratory variability, suggesting right atrial pressure of 3 mmHg. IAS/Shunts: No atrial level shunt detected by color flow Doppler. Additional Comments: 3D was performed not requiring image post processing on an independent workstation and was indeterminate.  LEFT VENTRICLE PLAX 2D LVIDd:         4.80 cm   Diastology LVIDs:         3.00 cm   LV e' medial:    6.96 cm/s LV PW:         1.30 cm   LV E/e' medial:  9.3 LV IVS:        1.00 cm   LV e' lateral:   10.70 cm/s  LVOT diam:     2.20 cm   LV E/e' lateral: 6.1 LV SV:  73 LV SV Index:   32 LVOT Area:     3.80 cm LV IVRT:       111 msec  RIGHT VENTRICLE RV Basal diam:  3.60 cm RV Mid diam:    3.50 cm LEFT ATRIUM             Index        RIGHT ATRIUM           Index LA diam:        2.50 cm 1.09 cm/m   RA Area:     16.80 cm LA Vol (A2C):   50.8 ml 22.06 ml/m  RA Volume:   43.10 ml  18.72 ml/m LA Vol (A4C):   26.8 ml 11.64 ml/m LA Biplane Vol: 38.2 ml 16.59 ml/m  AORTIC VALVE AV Area (Vmax):    2.21 cm AV Area (Vmean):   2.24 cm AV Area (VTI):     2.48 cm AV Vmax:           162.00 cm/s AV Vmean:          106.000 cm/s AV VTI:            0.293 m AV Peak Grad:      10.5 mmHg AV Mean Grad:      5.0 mmHg LVOT Vmax:         94.30 cm/s LVOT Vmean:        62.400 cm/s LVOT VTI:          0.191 m LVOT/AV VTI ratio: 0.65  AORTA Ao Root diam: 3.40 cm MITRAL VALVE               TRICUSPID VALVE MV Area (PHT): 3.46 cm    TR Peak grad:   5.7 mmHg MV Area VTI:   2.51 cm    TR Vmax:        119.00 cm/s MV Peak grad:  3.6 mmHg MV Mean grad:  2.0 mmHg    SHUNTS MV Vmax:       0.95 m/s    Systemic VTI:  0.19 m MV Vmean:      63.2 cm/s   Systemic Diam: 2.20 cm MV Decel Time: 219 msec MV E velocity: 65.00 cm/s MV A velocity: 81.80 cm/s MV E/A ratio:  0.79 Evalene Lunger MD Electronically signed by Evalene Lunger MD Signature Date/Time: 08/31/2024/1:34:00 PM    Final (Updated)    US  Venous Img Lower Bilateral (DVT) Result Date: 08/31/2024 EXAM: ULTRASOUND DUPLEX OF THE BILATERAL LOWER EXTREMITY VEINS TECHNIQUE: Duplex ultrasound using B-mode/gray scaled imaging and Doppler spectral analysis and color flow was obtained of the deep venous structures of the bilateral lower extremity. COMPARISON: None available. CLINICAL HISTORY: Pulmonary embolism (HCC). FINDINGS: LEFT: The common femoral vein, femoral vein, popliteal vein, and posterior tibial vein demonstrate normal compressibility with normal color flow and spectral analysis. RIGHT:  Occlusive thrombus is present within a posterior tibial vein in the right calf. The common femoral vein, femoral vein, and popliteal vein demonstrate normal compressibility with normal color flow and spectral analysis. IMPRESSION: 1. Occlusive thrombus within a posterior tibial vein in the right calf. 2. No evidence of left DVT. Electronically signed by: Pinkie Pebbles MD 08/31/2024 02:45 AM EST RP Workstation: [id number]   MR ABDOMEN W WO CONTRAST Result Date: 08/30/2024 EXAM: MRI Abdomen with and without Contrast 08/30/2024 05:04:16 PM TECHNIQUE: Multiplanar multisequence MRI of the abdomen was performed with and without the administration of 10 mL gadobutrol  (GADAVIST ) 1 MMOL/ML injection. COMPARISON:  CT scans 08/30/2024 and 08/20/2024. CLINICAL HISTORY: Acute pulmonary embolus, right renal tumor, assessment for venous clot. FINDINGS: LIVER: Mild diffuse hepatic steatosis. GALLBLADDER AND BILIARY SYSTEM: Gallbladder is unremarkable. No intrahepatic or extrahepatic ductal dilation. SPLEEN: Unremarkable. PANCREAS: Unremarkable. ADRENAL GLANDS: Unremarkable. KIDNEYS: Partially exophytic right mid kidney mass laterally measuring 5.6 x 6.6 cm on image 49 of series 18. This slightly abuts the capsular margin of the liver on image 25 series 20. Additional anterior right renal mass 2.9 x 3.3 cm on image 46 of series 18. No well defined thrombus in the right renal vein or inferior vena cava (IVC). Punctate appearing parapelvic renal cyst on the left greater than right. LYMPH NODES: No pathologic adenopathy in the upper abdomen. VASCULATURE: Patent portal vein. The superior mesenteric vein is patent. PERITONEUM: No ascites. BOWEL: Chronic stranding in the central mesentery, cannot exclude mild sclerosing mesenteritis. This was also present on 02/28/2020. ABDOMINAL WALL: No acute abnormality. BONES: Degenerative endplate findings at L2-L3, L3-L4, and L4-L5. IMPRESSION: 1. No well-defined thrombus in the right renal  vein or inferior vena cava. 2. Partially exophytic right mid kidney mass favoring renal cell carcinoma measuring 5.6 x 6.6 cm, slightly abutting the capsular margin of the liver. 3. Additional anterior right renal mass favoring renal cell carcinoma measuring 2.9 x 3.3 cm. 4. Chronic central mesenteric stranding, with mild sclerosing mesenteritis not excluded. 5. Degenerative endplate changes at L2-3, L3-4, and L4-5. 6. Mild diffuse hepatic steatosis. Electronically signed by: Ryan Salvage MD 08/30/2024 05:22 PM EST RP Workstation: [id number]   CT HEAD WO CONTRAST ( ) Result Date: 08/30/2024 CLINICAL DATA:  Head CT of the chest done this a.m., blood clots present and was told to come to ER. EXAM: CT HEAD WITHOUT CONTRAST TECHNIQUE: Contiguous axial images were obtained from the base of the skull through the vertex without intravenous contrast. RADIATION DOSE REDUCTION: This exam was performed according to the departmental dose-optimization program which includes automated exposure control, adjustment of the mA and/or kV according to patient size and/or use of iterative reconstruction technique. COMPARISON:  None Available. FINDINGS: Brain: No evidence of acute infarction, hemorrhage, hydrocephalus, extra-axial collection or mass lesion/mass effect. Vascular: No hyperdense vessel or unexpected calcification. Skull: Normal. Negative for fracture or focal lesion. Sinuses/Orbits: No acute finding. Other: None. IMPRESSION: No acute intracranial pathology. Electronically Signed   By: Suzen Dials M.D.   On: 08/30/2024 14:25   CT Chest W Contrast Result Date: 08/30/2024 CLINICAL DATA:  Right renal mass, staging workup. * Tracking Code: BO * EXAM: CT CHEST WITH CONTRAST TECHNIQUE: Multidetector CT imaging of the chest was performed during intravenous contrast administration. RADIATION DOSE REDUCTION: This exam was performed according to the departmental dose-optimization program which includes automated  exposure control, adjustment of the mA and/or kV according to patient size and/or use of iterative reconstruction technique. CONTRAST:  75mL OMNIPAQUE  IOHEXOL  300 MG/ML  SOLN COMPARISON:  CT abdomen 08/20/2024 and CT chest 02/18/2022. FINDINGS: Cardiovascular: Although the exam was not optimized for the detection of pulmonary emboli, there are bilateral pulmonary arterial filling defects with the most proximal clot seen at distal lobar levels in the right lower lobe and left upper lobe. RV/LV ratio 1.0. Heart is mildly enlarged. No pericardial effusion. Mediastinum/Nodes: No pathologically enlarged mediastinal, hilar or axillary lymph nodes. Esophagus is grossly unremarkable. Lungs/Pleura: Calcified granulomas. A few scattered pulmonary nodules measure up to 4 mm in the anterolateral left lower lobe (3/102), unchanged and benign. Minimal subpleural ground-glass in the medial left upper lobe (3/40), possibly  due to postinflammatory scarring. No pleural fluid. Airway is unremarkable. Upper Abdomen: Liver appears mildly enlarged but is incompletely imaged. 3.4 x 3.6 cm heterogeneous mass in the anterior interpolar right kidney (2/164). Partially imaged second heterogeneous mass in the interpolar right kidney measures approximately 6.0 x 6.1 cm (2/170). Portacaval lymph node measures 1.6 cm, as before. Small gastrohepatic ligament lymph nodes. Visualized portions of the liver, gallbladder, adrenal glands, kidneys, spleen, pancreas, stomach and bowel are otherwise grossly unremarkable. No upper abdominal adenopathy. Musculoskeletal: Degenerative changes in the spine. No worrisome lytic or sclerotic lesions. IMPRESSION: 1. Bilateral pulmonary emboli with the most proximal clot seen at the distal lobar level bilaterally. Positive for acute PE with CT evidence of right heart strain (RV/LV Ratio = 1.0) consistent with at least submassive (intermediate risk) PE. The presence of right heart strain has been associated with an  increased risk of morbidity and mortality. Please refer to the Code PE Focused order set in EPIC. Critical Value/emergent results were called by telephone at the time of interpretation on 08/30/2024 at 11:40 am to provider HEATER CARMEN, RN , who verbally acknowledged these results. 2. Right renal masses, compatible with renal cell carcinomas. No evidence of metastatic disease. Electronically Signed   By: Newell Eke M.D.   On: 08/30/2024 11:40   CT ABDOMEN PELVIS W CONTRAST Result Date: 08/20/2024 CLINICAL DATA:  Right flank pain and left lower quadrant pain. Noncontrast CT scan suspected a right renal mass. Follow-up study. EXAM: CT ABDOMEN AND PELVIS WITH CONTRAST TECHNIQUE: Multidetector CT imaging of the abdomen and pelvis was performed using the standard protocol following bolus administration of intravenous contrast. RADIATION DOSE REDUCTION: This exam was performed according to the departmental dose-optimization program which includes automated exposure control, adjustment of the mA and/or kV according to patient size and/or use of iterative reconstruction technique. CONTRAST:  OMNIPAQUE  IOHEXOL  300 MG/ML  SOLN COMPARISON:  CT scan from yesterday. FINDINGS: Lower chest: Small scattered calcified granulomas. No infiltrates or effusions. The heart is normal in size. No pericardial effusion. Hepatobiliary: Diffuse fatty infiltration of the liver no focal hepatic lesions or intrahepatic biliary dilatation. Gallbladder is unremarkable. No common bile duct dilatation. Pancreas: No mass, inflammation or ductal dilatation. Spleen: Normal size.  No focal lesions. Adrenals/Urinary Tract: The adrenal glands are normal. There are 2 right renal masses. The largest lesion and involves the interpolar region of the kidney laterally and measures approximately 6.5 x 5.9 x 5.2 cm. Very heterogeneous contrast enhancement. The second lesion is in the interpolar region anteriorly and medially. This measures 3.8 x  3.6 x 3.3 cm and has a very similar enhancement pattern. Findings consistent with renal cell carcinomas. No lesions involving the left kidney. Parapelvic left renal cysts are noted. There is a Foley catheter in the bladder. No bladder mass. Stomach/Bowel: The stomach, duodenum, small bowel and colon are unremarkable. No acute inflammatory process, mass lesions or obstructive findings. The terminal ileum and appendix are normal. Vascular/Lymphatic: The aorta and branch vessels are normal. The major venous structures are patent. No findings for tumor thrombus in the right renal vein. No retroperitoneal lymphadenopathy. He see appearance of the small bowel mesentery with small scattered lymph nodes consistent with benign mesenteritis or panniculitis, unchanged since 2021. Reproductive: The prostate gland and seminal vesicles are unremarkable. Other: No ascites. Small periumbilical abdominal wall hernia containing fat. No inguinal hernia or adenopathy. Musculoskeletal: No lytic or sclerotic bone lesions to suggest metastatic disease. Degenerative changes in the thoracic and lumbar spine. IMPRESSION:  1. Two right renal masses consistent with renal cell carcinomas. No findings for tumor thrombus in the right renal vein or retroperitoneal lymphadenopathy. 2. No findings for metastatic disease involving the abdomen/pelvis or bony structures. 3. Diffuse fatty infiltration of the liver. 4. Stable benign mesenteritis or panniculitis. Electronically Signed   By: MYRTIS Stammer M.D.   On: 08/20/2024 17:41   CT ABDOMEN PELVIS WO CONTRAST Result Date: 08/19/2024 CLINICAL DATA:  Hematuria.  History of kidney stones. EXAM: CT ABDOMEN AND PELVIS WITHOUT CONTRAST TECHNIQUE: Multidetector CT imaging of the abdomen and pelvis was performed following the standard protocol without IV contrast. RADIATION DOSE REDUCTION: This exam was performed according to the departmental dose-optimization program which includes automated exposure  control, adjustment of the mA and/or kV according to patient size and/or use of iterative reconstruction technique. COMPARISON:  02/28/2020 FINDINGS: Lower chest: Tiny calcified and noncalcified pulmonary nodules are seen at the lung bases. 2 mm right lower lobe noncalcified nodule image 38/4. 3 mm left lower lobe noncalcified nodule 29/4. Right lower lobe nodule stable since prior 2021 exam consistent with benign etiology. Left lower lobe nodule stable since chest CT 02/18/2022 consistent with benign etiology. No followup imaging is recommended. Hepatobiliary: The liver shows diffusely decreased attenuation suggesting fat deposition. No suspicious focal abnormality in the liver on this study without intravenous contrast. There is no evidence for gallstones, gallbladder wall thickening, or pericholecystic fluid. No intrahepatic or extrahepatic biliary dilation. Pancreas: No focal mass lesion. No dilatation of the main duct. No intraparenchymal cyst. No peripancreatic edema. Spleen: No splenomegaly. No suspicious focal mass lesion. Adrenals/Urinary Tract: No adrenal nodule or mass. Probable central sinus cysts in the left kidney, stable since 2021 exam. Interval development of 6.5 x 6.5 cm heterogeneous interpolar right renal mass, highly suspicious for renal cell carcinoma. No evidence for hydroureter. The urinary bladder appears normal for the degree of distention. Stomach/Bowel: Stomach is unremarkable. No gastric wall thickening. No evidence of outlet obstruction. Duodenum is normally positioned as is the ligament of Treitz. No small bowel wall thickening. No small bowel dilatation. The terminal ileum is normal. The appendix is not well visualized, but there is no edema or inflammation in the region of the cecal tip to suggest appendicitis. No gross colonic mass. No colonic wall thickening. Vascular/Lymphatic: No abdominal aortic aneurysm. No abdominal aortic atherosclerotic calcification. There is no  gastrohepatic or hepatoduodenal ligament lymphadenopathy. No retroperitoneal or mesenteric lymphadenopathy. Upper normal cardiac caval lymph node on 34/2 is stable since 2021. Haziness in the central small bowel mesentery with increased number of small lymph nodes also unchanged and nonspecific but potentially reflecting prior inflammation or infection. No pelvic sidewall lymphadenopathy. Reproductive: The prostate gland and seminal vesicles are unremarkable. Other: No substantial intraperitoneal free fluid. Musculoskeletal: No worrisome lytic or sclerotic osseous abnormality. IMPRESSION: 1. Interval development of 6.5 x 6.5 cm heterogeneous interpolar right renal mass, highly suspicious for renal cell carcinoma. MRI of the abdomen with and without contrast recommended to further evaluate. 2. No evidence for metastatic disease in the abdomen or pelvis. 3. Hepatic steatosis. Electronically Signed   By: Camellia Candle M.D.   On: 08/19/2024 08:49      Discharge Exam: Vitals:   08/31/24 2030 09/01/24 0347  BP: (!) 155/86 137/79  Pulse: 71 61  Resp: 18 20  Temp: (!) 97.5 F (36.4 C) 98 F (36.7 C)  SpO2: 96% 96%   Vitals:   08/31/24 1738 08/31/24 1800 08/31/24 2030 09/01/24 0347  BP: 112/70  ROLLEN)  155/86 137/79  Pulse: 69  71 61  Resp: (!) 21 18 18 20   Temp:   (!) 97.5 F (36.4 C) 98 F (36.7 C)  TempSrc:    Oral  SpO2: 96%  96% 96%  Weight:      Height:        Constitutional:  Normal appearance. Non toxic-appearing.  HENT: Head Normocephalic and atraumatic.  Mucous membranes are moist.  Eyes:  Extraocular intact. Conjunctivae normal.  Cardiovascular: Rate and Rhythm: Normal rate and regular rhythm.  Pulmonary: Non labored, symmetric rise of chest wall.  Skin: warm and dry. not jaundiced.  Neurological: No focal deficit present. alert. Oriented.  Psychiatric: Mood and Affect congruent.    The results of significant diagnostics from this hospitalization (including imaging,  microbiology, ancillary and laboratory) are listed below for reference.     Microbiology: Recent Results (from the past 240 hours)  Urine Culture     Status: None   Collection Time: 08/30/24  2:11 PM   Specimen: Urine, Clean Catch  Result Value Ref Range Status   Specimen Description   Final    URINE, CLEAN CATCH Performed at [facility], [address] [place], [place] [address]    Special Requests   Final    NONE Performed at [facility], [address], [place], [place] [address]    Culture   Final    NO GROWTH Performed at [facility], [address] [place], [place] [address]    Report Status 08/31/2024 FINAL  Final     Labs: BNP (last 3 results) No results for input(s): BNP in the last 8760 hours. Basic Metabolic Panel: Recent Labs  Lab 08/30/24 1311 08/30/24 1411  NA 136 137  K 3.8 3.9  CL 100 102  CO2 23 25  GLUCOSE 177* 141*  BUN 13 12  CREATININE 0.84 0.83  CALCIUM  9.6 9.2   Liver Function Tests: No results for input(s): AST, ALT, ALKPHOS, BILITOT, PROT, ALBUMIN in the last 168 hours. No results for input(s): LIPASE, AMYLASE in the last 168 hours. No results for input(s): AMMONIA in the last 168 hours. CBC: Recent Labs  Lab 08/30/24 1311 08/30/24 1411 08/31/24 0136 09/01/24 0808  WBC 7.4 7.0 8.2 6.1  HGB 13.9 13.8 13.2 14.1  HCT 40.3 40.4 38.8* 41.7  MCV 87.0 86.7 87.2 86.9  PLT 278 260 277 265   Cardiac Enzymes: No results for input(s): CKTOTAL, CKMB, CKMBINDEX, TROPONINI in the last 168 hours. BNP: Invalid input(s): POCBNP CBG: Recent Labs  Lab 08/31/24 1124 08/31/24 1600 08/31/24 2125 09/01/24 0749 09/01/24 1109  GLUCAP 110* 109* 130* 104* 103*   D-Dimer No results for input(s): DDIMER in the last 72 hours. Hgb A1c Recent Labs    08/31/24 0136  HGBA1C 6.4*   Lipid Profile No results for input(s): CHOL, HDL, LDLCALC, TRIG, CHOLHDL, LDLDIRECT in the  last 72 hours. Thyroid  function studies No results for input(s): TSH, T4TOTAL, T3FREE, THYROIDAB in the last 72 hours.  Invalid input(s): FREET3 Anemia work up No results for input(s): VITAMINB12, FOLATE, FERRITIN, TIBC, IRON, RETICCTPCT in the last 72 hours. Urinalysis    Component Value Date/Time   COLORURINE STRAW (A) 08/30/2024 1411   APPEARANCEUR CLEAR (A) 08/30/2024 1411   LABSPEC 1.027 08/30/2024 1411   PHURINE 6.0 08/30/2024 1411   GLUCOSEU NEGATIVE 08/30/2024 1411   HGBUR NEGATIVE 08/30/2024 1411   BILIRUBINUR NEGATIVE 08/30/2024 1411   KETONESUR NEGATIVE 08/30/2024 1411   PROTEINUR NEGATIVE 08/30/2024  1411   NITRITE NEGATIVE 08/30/2024 1411   LEUKOCYTESUR NEGATIVE 08/30/2024 1411   Sepsis Labs Recent Labs  Lab 08/30/24 1311 08/30/24 1411 08/31/24 0136 09/01/24 0808  WBC 7.4 7.0 8.2 6.1   Microbiology Recent Results (from the past 240 hours)  Urine Culture     Status: None   Collection Time: 08/30/24  2:11 PM   Specimen: Urine, Clean Catch  Result Value Ref Range Status   Specimen Description   Final    URINE, CLEAN CATCH Performed at [facility], [address], [place], [place] [address]    Special Requests   Final    NONE Performed at [facility], [address] [place], [place] [address]    Culture   Final    NO GROWTH Performed at [facility], [address], [place], [place] [address]    Report Status 08/31/2024 FINAL  Final     Time coordinating discharge: 32 min   SIGNED: Younique Casad, DO [facility] 09/01/2024, 12:31 PM Pager   If 7PM-7AM, please contact night-coverage     [1]  Allergies Allergen Reactions   Levetiracetam Other (See Comments)    Irritable, mood swings   Penicillins Rash    Has patient had a PCN reaction causing immediate rash, facial/tongue/throat swelling, SOB or lightheadedness with hypotension: Unknown Has patient had a PCN reaction causing severe  rash involving mucus membranes or skin necrosis: Unknown Has patient had a PCN reaction that required hospitalization: No Has patient had a PCN reaction occurring within the last 10 years: No If all of the above answers are NO, then may proceed with Cephalosporin use.    "

## 2024-09-01 NOTE — Consult Note (Addendum)
 "  Hematology/Oncology Consult note [phone] [phone]      Patient Care Team: Steva Clotilda DEL, NP as PCP - General (Family Medicine) Perla Evalene PARAS, MD as PCP - Cardiology (Cardiology) Fernande Elspeth BROCKS, MD (Inactive) as PCP - Electrophysiology (Cardiology) Babara Call, MD as Consulting Physician (Oncology)   Name of the patient: Maxwell Davis  [id number]  October 18, 1965   REASON FOR COSULTATION:  RCC bilateral PE and lower extremity DVT. History of presenting illness-  59 y.o. male with newly diagnosed right-sided renal mass, HTN, IIDM, who was sent to emergency room by me after reviewing his staging CT chest with contrast which showed bilateral PE with right heart strain. Patient denies shortness of breath, chest pain hemoptysis.  Denies extremity edema.  He has a history of hematuria which prompted CT abdomen which showed a right side renal mass, and there is plan of upcoming nephrectomy in early February.  Hematuria has stopped. Bilateral lower extremity venous ultrasound showed occlusive thrombus within a posterior tibial vein in the right calf. No evidence of left DVT. Patient has been evaluated by vascular surgeon and urology since admission. Family members including sister and mother at the bedside.  Allergies[1]  Patient Active Problem List   Diagnosis Date Noted   Right renal mass 08/21/2024    Priority: High   Family history of cancer 08/21/2024    Priority: Medium    DVT (deep venous thrombosis) (HCC) 08/31/2024   Acute pulmonary embolism (HCC) 08/30/2024   PE (pulmonary thromboembolism) (HCC) 08/30/2024   NSVT (nonsustained ventricular tachycardia) (HCC) 02/18/2022   Lung nodule 02/18/2022   Essential hypertension    Hyperlipidemia    Obesity with body mass index of 30.0-39.9    Obstructive sleep apnea on CPAP 06/25/2021   Vasovagal syncope 08/26/2017   Seizure disorder (HCC) 08/26/2017     Past Medical History:  Diagnosis Date    Essential hypertension    Headache    Hepatic steatosis    History of PSVT (paroxysmal supraventricular tachycardia)    Hyperlipidemia    Kidney stone    Morbid obesity (HCC)    OSA (obstructive sleep apnea)    Pre-diabetes    Seizures (HCC)    a. On dilantin .  Last seizure 1995.   Vasovagal syncope      Past Surgical History:  Procedure Laterality Date   COLONOSCOPY WITH PROPOFOL  N/A 06/10/2015   Procedure: COLONOSCOPY WITH PROPOFOL ;  Surgeon: Gladis RAYMOND Mariner, MD;  Location: [facility] ENDOSCOPY;  Service: Endoscopy;  Laterality: N/A;   COLONOSCOPY WITH PROPOFOL  N/A 11/19/2023   Procedure: COLONOSCOPY WITH PROPOFOL ;  Surgeon: Maryruth Ole DASEN, MD;  Location: [facility] ENDOSCOPY;  Service: Endoscopy;  Laterality: N/A;   FRACTURE SURGERY     IVC FILTER INSERTION N/A 08/31/2024   Procedure: IVC FILTER INSERTION;  Surgeon: Marea Selinda RAMAN, MD;  Location: [facility] INVASIVE CV LAB;  Service: Cardiovascular;  Laterality: N/A;   ORIF TIBIAL SHAFT FRACTURE W/ PLATES AND SCREWS Right    right leg surgery     for tibial fracture    Social History   Socioeconomic History   Marital status: Married    Spouse name: Not on file   Number of children: Not on file   Years of education: Not on file   Highest education level: Not on file  Occupational History   Not on file  Tobacco Use   Smoking status: Never   Smokeless tobacco: Never  Vaping Use   Vaping status: Never Used  Substance  and Sexual Activity   Alcohol use: No   Drug use: No   Sexual activity: Not on file  Other Topics Concern   Not on file  Social History Narrative   Lives in [place] with his wife and dog.  He does not routinely exercise.   Social Drivers of Health   Tobacco Use: Low Risk (08/30/2024)   Patient History    Smoking Tobacco Use: Never    Smokeless Tobacco Use: Never    Passive Exposure: Not on file  Financial Resource Strain: Low Risk  (06/12/2024)   Received from [facility]   Overall Financial  Resource Strain (CARDIA)    Difficulty of Paying Living Expenses: Not very hard  Food Insecurity: No Food Insecurity (08/21/2024)   Epic    Worried About Running Out of Food in the Last Year: Never true    Ran Out of Food in the Last Year: Never true  Transportation Needs: No Transportation Needs (08/21/2024)   Epic    Lack of Transportation (Medical): No    Lack of Transportation (Non-Medical): No  Physical Activity: Insufficiently Active (06/12/2024)   Received from [facility]   Exercise Vital Sign    On average, how many days per week do you engage in moderate to strenuous exercise (like a brisk walk)?: 3 days    On average, how many minutes do you engage in exercise at this level?: 20 min  Stress: No Stress Concern Present (06/12/2024)   Received from [facility] - Occupational Stress Questionnaire    Feeling of Stress : Only a little  Social Connections: Moderately Integrated (06/12/2024)   Received from [facility]   Social Connection and Isolation Panel    In a typical week, how many times do you talk on the phone with family, friends, or neighbors?: More than three times a week    How often do you get together with friends or relatives?: Twice a week    How often do you attend church or religious services?: Never    Do you belong to any clubs or organizations such as church groups, unions, fraternal or athletic groups, or school groups?: Yes    How often do you attend meetings of the clubs or organizations you belong to?: More than 4 times per year    Are you married, widowed, divorced, separated, never married, or living with a partner?: Married  Intimate Partner Violence: Not At Risk (08/21/2024)   Epic    Fear of Current or Ex-Partner: No    Emotionally Abused: No    Physically Abused: No    Sexually Abused: No  Depression (PHQ2-9): Low Risk (08/21/2024)   Depression (PHQ2-9)     PHQ-2 Score: 0  Alcohol Screen: Not on file  Housing: Low Risk (08/21/2024)   Epic    Unable to Pay for Housing in the Last Year: No    Number of Times Moved in the Last Year: 0    Homeless in the Last Year: No  Utilities: Not At Risk (08/21/2024)   Epic    Threatened with loss of utilities: No  Health Literacy: Adequate Health Literacy (06/12/2024)   Received from [facility]   [phone] Health Literacy    How often do you need to have someone help you when you read instructions, pamphlets, or other written material from your doctor or pharmacy?: Never     Family History  Problem Relation Age of Onset   Diabetes Mother        in her 10's   Hypertension Mother    Prostate cancer Father        in his 89's   CAD Maternal Grandmother    CAD Maternal Grandfather    Other Sister        alive and well   Heart attack Brother        died in his 79's    Current Medications[2]  Review of Systems  Constitutional:  Negative for appetite change, chills, fatigue and fever.  HENT:   Negative for hearing loss and voice change.   Eyes:  Negative for eye problems.  Respiratory:  Negative for chest tightness and cough.   Cardiovascular:  Negative for chest pain.  Gastrointestinal:  Negative for abdominal distention, abdominal pain and blood in stool.  Endocrine: Negative for hot flashes.  Genitourinary:  Negative for difficulty urinating and frequency.   Musculoskeletal:  Negative for arthralgias.  Skin:  Negative for itching and rash.  Neurological:  Negative for extremity weakness.  Hematological:  Negative for adenopathy.  Psychiatric/Behavioral:  Negative for confusion.     PHYSICAL EXAM Vitals:   08/31/24 1738 08/31/24 1800 08/31/24 2030 09/01/24 0347  BP: 112/70  (!) 155/86 137/79  Pulse: 69  71 61  Resp: (!) 21 18 18 20   Temp:   (!) 97.5 F (36.4 C) 98 F (36.7 C)  TempSrc:    Oral  SpO2: 96%  96% 96%  Weight:      Height:       Physical  Exam Constitutional:      General: He is not in acute distress.    Appearance: He is obese. He is not diaphoretic.  HENT:     Head: Normocephalic and atraumatic.  Eyes:     General: No scleral icterus. Cardiovascular:     Rate and Rhythm: Normal rate and regular rhythm.  Pulmonary:     Effort: Pulmonary effort is normal. No respiratory distress.     Breath sounds: Normal breath sounds. No wheezing.  Abdominal:     General: There is no distension.     Palpations: Abdomen is soft.     Tenderness: There is no abdominal tenderness.  Musculoskeletal:        General: Normal range of motion.     Cervical back: Normal range of motion and neck supple.  Skin:    General: Skin is warm and dry.     Findings: No erythema.  Neurological:     Mental Status: He is alert and oriented to person, place, and time. Mental status is at baseline.     Motor: No abnormal muscle tone.  Psychiatric:        Mood and Affect: Mood and affect normal.       LABORATORY STUDIES    Latest Ref Rng & Units 08/31/2024    1:36 AM 08/30/2024    2:11 PM 08/30/2024    1:11 PM  CBC  WBC 4.0 - 10.5 K/uL 8.2  7.0  7.4   Hemoglobin 13.0 - 17.0 g/dL 86.7  86.1  86.0   Hematocrit 39.0 - 52.0 % 38.8  40.4  40.3   Platelets 150 - 400 K/uL 277  260  278       Latest Ref Rng & Units 08/30/2024    2:11 PM 08/30/2024    1:11 PM 08/20/2024    3:02 PM  CMP  Glucose 70 -  99 mg/dL 858  822  882   BUN 6 - 20 mg/dL 12  13  18    Creatinine 0.61 - 1.24 mg/dL 9.16  9.15  9.02   Sodium 135 - 145 mmol/L 137  136  137   Potassium 3.5 - 5.1 mmol/L 3.9  3.8  3.9   Chloride 98 - 111 mmol/L 102  100  100   CO2 22 - 32 mmol/L 25  23  25    Calcium  8.9 - 10.3 mg/dL 9.2  9.6  89.7   Total Protein 6.5 - 8.1 g/dL   8.0   Total Bilirubin 0.0 - 1.2 mg/dL   0.5   Alkaline Phos 38 - 126 U/L   73   AST 15 - 41 U/L   31   ALT 0 - 44 U/L   47      RADIOGRAPHIC STUDIES: I have personally reviewed the radiological images as listed and  agreed with the findings in the report. PERIPHERAL VASCULAR CATHETERIZATION Result Date: 08/31/2024 See surgical note for result.  ECHOCARDIOGRAM COMPLETE Result Date: 08/31/2024    ECHOCARDIOGRAM REPORT   Patient Name:   Derrious RENWICK ASMAN Date of Exam: 08/31/2024 Medical Rec #:  [id number]      Height:       70.0 in Accession #:    [id number]     Weight:       252.0 lb Date of Birth:  04/06/1966      BSA:          2.303 m Patient Age:    58 years       BP:           134/74 mmHg Patient Gender: M              HR:           58 bpm. Exam Location:  [facility] Procedure: 2D Echo, Cardiac Doppler and Color Doppler (Both Spectral and Color            Flow Doppler were utilized during procedure). Indications:     Pulmonary embolus I26.09  History:         Patient has prior history of Echocardiogram examinations, most                  recent 08/26/2017. Risk Factors:Hypertension and Dyslipidemia.  Sonographer:     Christopher Furnace Referring Phys:  [id number] CORT ONEIDA MANA Diagnosing Phys: Evalene Lunger MD IMPRESSIONS  1. Left ventricular ejection fraction, by estimation, is 55 to 60%. The left ventricle has normal function. The left ventricle has no regional wall motion abnormalities. Left ventricular diastolic parameters are consistent with Grade I diastolic dysfunction (impaired relaxation).  2. Right ventricular systolic function is normal. The right ventricular size is mildly enlarged.  3. The mitral valve is normal in structure. No evidence of mitral valve regurgitation. No evidence of mitral stenosis.  4. The aortic valve is tricuspid. Aortic valve regurgitation is not visualized. No aortic stenosis is present.  5. The inferior vena cava is normal in size with greater than 50% respiratory variability, suggesting right atrial pressure of 3 mmHg. FINDINGS  Left Ventricle: Left ventricular ejection fraction, by estimation, is 55 to 60%. The left ventricle has normal function. The left ventricle has no regional wall motion  abnormalities. Strain was performed and the global longitudinal strain is indeterminate. The left ventricular internal cavity size was normal in size. There is no left ventricular hypertrophy. Left ventricular diastolic parameters are consistent  with Grade I diastolic dysfunction (impaired relaxation). Right Ventricle: The right ventricular size is mildly enlarged. No increase in right ventricular wall thickness. Right ventricular systolic function is normal. Left Atrium: Left atrial size was normal in size. Right Atrium: Right atrial size was normal in size. Pericardium: There is no evidence of pericardial effusion. Mitral Valve: The mitral valve is normal in structure. No evidence of mitral valve regurgitation. No evidence of mitral valve stenosis. MV peak gradient, 3.6 mmHg. The mean mitral valve gradient is 2.0 mmHg. Tricuspid Valve: The tricuspid valve is normal in structure. Tricuspid valve regurgitation is mild . No evidence of tricuspid stenosis. Aortic Valve: The aortic valve is tricuspid. Aortic valve regurgitation is not visualized. No aortic stenosis is present. Aortic valve mean gradient measures 5.0 mmHg. Aortic valve peak gradient measures 10.5 mmHg. Aortic valve area, by VTI measures 2.48  cm. Pulmonic Valve: The pulmonic valve was normal in structure. Pulmonic valve regurgitation is not visualized. No evidence of pulmonic stenosis. Aorta: The aortic root is normal in size and structure. Venous: The inferior vena cava is normal in size with greater than 50% respiratory variability, suggesting right atrial pressure of 3 mmHg. IAS/Shunts: No atrial level shunt detected by color flow Doppler. Additional Comments: 3D was performed not requiring image post processing on an independent workstation and was indeterminate.  LEFT VENTRICLE PLAX 2D LVIDd:         4.80 cm   Diastology LVIDs:         3.00 cm   LV e' medial:    6.96 cm/s LV PW:         1.30 cm   LV E/e' medial:  9.3 LV IVS:        1.00 cm   LV e'  lateral:   10.70 cm/s LVOT diam:     2.20 cm   LV E/e' lateral: 6.1 LV SV:         73 LV SV Index:   32 LVOT Area:     3.80 cm LV IVRT:       111 msec  RIGHT VENTRICLE RV Basal diam:  3.60 cm RV Mid diam:    3.50 cm LEFT ATRIUM             Index        RIGHT ATRIUM           Index LA diam:        2.50 cm 1.09 cm/m   RA Area:     16.80 cm LA Vol (A2C):   50.8 ml 22.06 ml/m  RA Volume:   43.10 ml  18.72 ml/m LA Vol (A4C):   26.8 ml 11.64 ml/m LA Biplane Vol: 38.2 ml 16.59 ml/m  AORTIC VALVE AV Area (Vmax):    2.21 cm AV Area (Vmean):   2.24 cm AV Area (VTI):     2.48 cm AV Vmax:           162.00 cm/s AV Vmean:          106.000 cm/s AV VTI:            0.293 m AV Peak Grad:      10.5 mmHg AV Mean Grad:      5.0 mmHg LVOT Vmax:         94.30 cm/s LVOT Vmean:        62.400 cm/s LVOT VTI:          0.191 m LVOT/AV VTI ratio: 0.65  AORTA Ao  Root diam: 3.40 cm MITRAL VALVE               TRICUSPID VALVE MV Area (PHT): 3.46 cm    TR Peak grad:   5.7 mmHg MV Area VTI:   2.51 cm    TR Vmax:        119.00 cm/s MV Peak grad:  3.6 mmHg MV Mean grad:  2.0 mmHg    SHUNTS MV Vmax:       0.95 m/s    Systemic VTI:  0.19 m MV Vmean:      63.2 cm/s   Systemic Diam: 2.20 cm MV Decel Time: 219 msec MV E velocity: 65.00 cm/s MV A velocity: 81.80 cm/s MV E/A ratio:  0.79 Evalene Lunger MD Electronically signed by Evalene Lunger MD Signature Date/Time: 08/31/2024/1:34:00 PM    Final (Updated)    US  Venous Img Lower Bilateral (DVT) Result Date: 08/31/2024 EXAM: ULTRASOUND DUPLEX OF THE BILATERAL LOWER EXTREMITY VEINS TECHNIQUE: Duplex ultrasound using B-mode/gray scaled imaging and Doppler spectral analysis and color flow was obtained of the deep venous structures of the bilateral lower extremity. COMPARISON: None available. CLINICAL HISTORY: Pulmonary embolism (HCC). FINDINGS: LEFT: The common femoral vein, femoral vein, popliteal vein, and posterior tibial vein demonstrate normal compressibility with normal color flow and  spectral analysis. RIGHT: Occlusive thrombus is present within a posterior tibial vein in the right calf. The common femoral vein, femoral vein, and popliteal vein demonstrate normal compressibility with normal color flow and spectral analysis. IMPRESSION: 1. Occlusive thrombus within a posterior tibial vein in the right calf. 2. No evidence of left DVT. Electronically signed by: Pinkie Pebbles MD 08/31/2024 02:45 AM EST RP Workstation: [id number]   MR ABDOMEN W WO CONTRAST Result Date: 08/30/2024 EXAM: MRI Abdomen with and without Contrast 08/30/2024 05:04:16 PM TECHNIQUE: Multiplanar multisequence MRI of the abdomen was performed with and without the administration of 10 mL gadobutrol  (GADAVIST ) 1 MMOL/ML injection. COMPARISON: CT scans 08/30/2024 and 08/20/2024. CLINICAL HISTORY: Acute pulmonary embolus, right renal tumor, assessment for venous clot. FINDINGS: LIVER: Mild diffuse hepatic steatosis. GALLBLADDER AND BILIARY SYSTEM: Gallbladder is unremarkable. No intrahepatic or extrahepatic ductal dilation. SPLEEN: Unremarkable. PANCREAS: Unremarkable. ADRENAL GLANDS: Unremarkable. KIDNEYS: Partially exophytic right mid kidney mass laterally measuring 5.6 x 6.6 cm on image 49 of series 18. This slightly abuts the capsular margin of the liver on image 25 series 20. Additional anterior right renal mass 2.9 x 3.3 cm on image 46 of series 18. No well defined thrombus in the right renal vein or inferior vena cava (IVC). Punctate appearing parapelvic renal cyst on the left greater than right. LYMPH NODES: No pathologic adenopathy in the upper abdomen. VASCULATURE: Patent portal vein. The superior mesenteric vein is patent. PERITONEUM: No ascites. BOWEL: Chronic stranding in the central mesentery, cannot exclude mild sclerosing mesenteritis. This was also present on 02/28/2020. ABDOMINAL WALL: No acute abnormality. BONES: Degenerative endplate findings at L2-L3, L3-L4, and L4-L5. IMPRESSION: 1. No well-defined  thrombus in the right renal vein or inferior vena cava. 2. Partially exophytic right mid kidney mass favoring renal cell carcinoma measuring 5.6 x 6.6 cm, slightly abutting the capsular margin of the liver. 3. Additional anterior right renal mass favoring renal cell carcinoma measuring 2.9 x 3.3 cm. 4. Chronic central mesenteric stranding, with mild sclerosing mesenteritis not excluded. 5. Degenerative endplate changes at L2-3, L3-4, and L4-5. 6. Mild diffuse hepatic steatosis. Electronically signed by: Ryan Salvage MD 08/30/2024 05:22 PM EST RP Workstation: [id number]   CT HEAD WO  CONTRAST ( ) Result Date: 08/30/2024 CLINICAL DATA:  Head CT of the chest done this a.m., blood clots present and was told to come to ER. EXAM: CT HEAD WITHOUT CONTRAST TECHNIQUE: Contiguous axial images were obtained from the base of the skull through the vertex without intravenous contrast. RADIATION DOSE REDUCTION: This exam was performed according to the departmental dose-optimization program which includes automated exposure control, adjustment of the mA and/or kV according to patient size and/or use of iterative reconstruction technique. COMPARISON:  None Available. FINDINGS: Brain: No evidence of acute infarction, hemorrhage, hydrocephalus, extra-axial collection or mass lesion/mass effect. Vascular: No hyperdense vessel or unexpected calcification. Skull: Normal. Negative for fracture or focal lesion. Sinuses/Orbits: No acute finding. Other: None. IMPRESSION: No acute intracranial pathology. Electronically Signed   By: Suzen Dials M.D.   On: 08/30/2024 14:25   CT Chest W Contrast Result Date: 08/30/2024 CLINICAL DATA:  Right renal mass, staging workup. * Tracking Code: BO * EXAM: CT CHEST WITH CONTRAST TECHNIQUE: Multidetector CT imaging of the chest was performed during intravenous contrast administration. RADIATION DOSE REDUCTION: This exam was performed according to the departmental dose-optimization program  which includes automated exposure control, adjustment of the mA and/or kV according to patient size and/or use of iterative reconstruction technique. CONTRAST:  75mL OMNIPAQUE  IOHEXOL  300 MG/ML  SOLN COMPARISON:  CT abdomen 08/20/2024 and CT chest 02/18/2022. FINDINGS: Cardiovascular: Although the exam was not optimized for the detection of pulmonary emboli, there are bilateral pulmonary arterial filling defects with the most proximal clot seen at distal lobar levels in the right lower lobe and left upper lobe. RV/LV ratio 1.0. Heart is mildly enlarged. No pericardial effusion. Mediastinum/Nodes: No pathologically enlarged mediastinal, hilar or axillary lymph nodes. Esophagus is grossly unremarkable. Lungs/Pleura: Calcified granulomas. A few scattered pulmonary nodules measure up to 4 mm in the anterolateral left lower lobe (3/102), unchanged and benign. Minimal subpleural ground-glass in the medial left upper lobe (3/40), possibly due to postinflammatory scarring. No pleural fluid. Airway is unremarkable. Upper Abdomen: Liver appears mildly enlarged but is incompletely imaged. 3.4 x 3.6 cm heterogeneous mass in the anterior interpolar right kidney (2/164). Partially imaged second heterogeneous mass in the interpolar right kidney measures approximately 6.0 x 6.1 cm (2/170). Portacaval lymph node measures 1.6 cm, as before. Small gastrohepatic ligament lymph nodes. Visualized portions of the liver, gallbladder, adrenal glands, kidneys, spleen, pancreas, stomach and bowel are otherwise grossly unremarkable. No upper abdominal adenopathy. Musculoskeletal: Degenerative changes in the spine. No worrisome lytic or sclerotic lesions. IMPRESSION: 1. Bilateral pulmonary emboli with the most proximal clot seen at the distal lobar level bilaterally. Positive for acute PE with CT evidence of right heart strain (RV/LV Ratio = 1.0) consistent with at least submassive (intermediate risk) PE. The presence of right heart strain has  been associated with an increased risk of morbidity and mortality. Please refer to the Code PE Focused order set in EPIC. Critical Value/emergent results were called by telephone at the time of interpretation on 08/30/2024 at 11:40 am to provider HEATER CARMEN, RN , who verbally acknowledged these results. 2. Right renal masses, compatible with renal cell carcinomas. No evidence of metastatic disease. Electronically Signed   By: Newell Eke M.D.   On: 08/30/2024 11:40   CT ABDOMEN PELVIS W CONTRAST Result Date: 08/20/2024 CLINICAL DATA:  Right flank pain and left lower quadrant pain. Noncontrast CT scan suspected a right renal mass. Follow-up study. EXAM: CT ABDOMEN AND PELVIS WITH CONTRAST TECHNIQUE: Multidetector CT imaging of the abdomen  and pelvis was performed using the standard protocol following bolus administration of intravenous contrast. RADIATION DOSE REDUCTION: This exam was performed according to the departmental dose-optimization program which includes automated exposure control, adjustment of the mA and/or kV according to patient size and/or use of iterative reconstruction technique. CONTRAST:  OMNIPAQUE  IOHEXOL  300 MG/ML  SOLN COMPARISON:  CT scan from yesterday. FINDINGS: Lower chest: Small scattered calcified granulomas. No infiltrates or effusions. The heart is normal in size. No pericardial effusion. Hepatobiliary: Diffuse fatty infiltration of the liver no focal hepatic lesions or intrahepatic biliary dilatation. Gallbladder is unremarkable. No common bile duct dilatation. Pancreas: No mass, inflammation or ductal dilatation. Spleen: Normal size.  No focal lesions. Adrenals/Urinary Tract: The adrenal glands are normal. There are 2 right renal masses. The largest lesion and involves the interpolar region of the kidney laterally and measures approximately 6.5 x 5.9 x 5.2 cm. Very heterogeneous contrast enhancement. The second lesion is in the interpolar region anteriorly and  medially. This measures 3.8 x 3.6 x 3.3 cm and has a very similar enhancement pattern. Findings consistent with renal cell carcinomas. No lesions involving the left kidney. Parapelvic left renal cysts are noted. There is a Foley catheter in the bladder. No bladder mass. Stomach/Bowel: The stomach, duodenum, small bowel and colon are unremarkable. No acute inflammatory process, mass lesions or obstructive findings. The terminal ileum and appendix are normal. Vascular/Lymphatic: The aorta and branch vessels are normal. The major venous structures are patent. No findings for tumor thrombus in the right renal vein. No retroperitoneal lymphadenopathy. He see appearance of the small bowel mesentery with small scattered lymph nodes consistent with benign mesenteritis or panniculitis, unchanged since 2021. Reproductive: The prostate gland and seminal vesicles are unremarkable. Other: No ascites. Small periumbilical abdominal wall hernia containing fat. No inguinal hernia or adenopathy. Musculoskeletal: No lytic or sclerotic bone lesions to suggest metastatic disease. Degenerative changes in the thoracic and lumbar spine. IMPRESSION: 1. Two right renal masses consistent with renal cell carcinomas. No findings for tumor thrombus in the right renal vein or retroperitoneal lymphadenopathy. 2. No findings for metastatic disease involving the abdomen/pelvis or bony structures. 3. Diffuse fatty infiltration of the liver. 4. Stable benign mesenteritis or panniculitis. Electronically Signed   By: MYRTIS Stammer M.D.   On: 08/20/2024 17:41   CT ABDOMEN PELVIS WO CONTRAST Result Date: 08/19/2024 CLINICAL DATA:  Hematuria.  History of kidney stones. EXAM: CT ABDOMEN AND PELVIS WITHOUT CONTRAST TECHNIQUE: Multidetector CT imaging of the abdomen and pelvis was performed following the standard protocol without IV contrast. RADIATION DOSE REDUCTION: This exam was performed according to the departmental dose-optimization program which  includes automated exposure control, adjustment of the mA and/or kV according to patient size and/or use of iterative reconstruction technique. COMPARISON:  02/28/2020 FINDINGS: Lower chest: Tiny calcified and noncalcified pulmonary nodules are seen at the lung bases. 2 mm right lower lobe noncalcified nodule image 38/4. 3 mm left lower lobe noncalcified nodule 29/4. Right lower lobe nodule stable since prior 2021 exam consistent with benign etiology. Left lower lobe nodule stable since chest CT 02/18/2022 consistent with benign etiology. No followup imaging is recommended. Hepatobiliary: The liver shows diffusely decreased attenuation suggesting fat deposition. No suspicious focal abnormality in the liver on this study without intravenous contrast. There is no evidence for gallstones, gallbladder wall thickening, or pericholecystic fluid. No intrahepatic or extrahepatic biliary dilation. Pancreas: No focal mass lesion. No dilatation of the main duct. No intraparenchymal cyst. No peripancreatic edema. Spleen: No  splenomegaly. No suspicious focal mass lesion. Adrenals/Urinary Tract: No adrenal nodule or mass. Probable central sinus cysts in the left kidney, stable since 2021 exam. Interval development of 6.5 x 6.5 cm heterogeneous interpolar right renal mass, highly suspicious for renal cell carcinoma. No evidence for hydroureter. The urinary bladder appears normal for the degree of distention. Stomach/Bowel: Stomach is unremarkable. No gastric wall thickening. No evidence of outlet obstruction. Duodenum is normally positioned as is the ligament of Treitz. No small bowel wall thickening. No small bowel dilatation. The terminal ileum is normal. The appendix is not well visualized, but there is no edema or inflammation in the region of the cecal tip to suggest appendicitis. No gross colonic mass. No colonic wall thickening. Vascular/Lymphatic: No abdominal aortic aneurysm. No abdominal aortic atherosclerotic  calcification. There is no gastrohepatic or hepatoduodenal ligament lymphadenopathy. No retroperitoneal or mesenteric lymphadenopathy. Upper normal cardiac caval lymph node on 34/2 is stable since 2021. Haziness in the central small bowel mesentery with increased number of small lymph nodes also unchanged and nonspecific but potentially reflecting prior inflammation or infection. No pelvic sidewall lymphadenopathy. Reproductive: The prostate gland and seminal vesicles are unremarkable. Other: No substantial intraperitoneal free fluid. Musculoskeletal: No worrisome lytic or sclerotic osseous abnormality. IMPRESSION: 1. Interval development of 6.5 x 6.5 cm heterogeneous interpolar right renal mass, highly suspicious for renal cell carcinoma. MRI of the abdomen with and without contrast recommended to further evaluate. 2. No evidence for metastatic disease in the abdomen or pelvis. 3. Hepatic steatosis. Electronically Signed   By: Camellia Candle M.D.   On: 08/19/2024 08:49     Assessment and plan-   Bilateral pulmonary embolism, CT evidence of right heart strain-submassive Right lower extremity DVT.  Right renal mass This is likely secondary to hypercoagulable state due to right renal mass -suspected RCC. MRI abdomen did not show well-defined thrombus in the right renal vein or inferior vena cava.  Discussed with vascular surgery and urology. Agree with heparin  drip for anticoagulation. Agree with vascular surgery recommendation of IVC filter in light of upcoming surgery. High thrombosis risk perioperatively.  However is also at high risk of recurrent hematuria due to RCC and anticoagulation may be interrupted or discontinued if he develops severe blood loss. I recommend patient to continue uninterrupted anticoagulation for at least 2 to 3 weeks prior to surgery.  He will need to be bridged with heparin  preoperatively to minimize interval of anticoagulation interruption.  Postoperatively, I recommend  heparin  gtt without loading dose to be resumed as soon as hemostasis is achieved. He needs preop cardiology clearance. Risk of recurrent thrombosis is high, IVC filter may decrease chance of recurrent PE.  Discussed with patient about option of Lovenox  versus Eliquis  for anticoagulation outpatient.  Patient elects to get Eliquis  as outpatient option.  Recommend switching to Eliquis  at the time of discharge. He agrees with above recommendation.   He will follow-up at cancer center outpatient for monitoring of hemoglobin.  Thank you for allowing me to participate in the care of this patient.   Zelphia Cap, MD, PhD Hematology Oncology 09/01/2024        [1]  Allergies Allergen Reactions   Levetiracetam Other (See Comments)    Irritable, mood swings   Penicillins Rash    Has patient had a PCN reaction causing immediate rash, facial/tongue/throat swelling, SOB or lightheadedness with hypotension: Unknown Has patient had a PCN reaction causing severe rash involving mucus membranes or skin necrosis: Unknown Has patient had a PCN reaction that  required hospitalization: No Has patient had a PCN reaction occurring within the last 10 years: No If all of the above answers are NO, then may proceed with Cephalosporin use.   [2]  Current Facility-Administered Medications:    acetaminophen  (TYLENOL ) tablet 650 mg, 650 mg, Oral, Q6H PRN **OR** acetaminophen  (TYLENOL ) suppository 650 mg, 650 mg, Rectal, Q6H PRN, Dew, Selinda RAMAN, MD   aspirin  EC tablet 81 mg, 81 mg, Oral, Daily, Dew, Jason S, MD, 81 mg at 09/01/24 0827   [COMPLETED] heparin  bolus via infusion 6,800 Units, 6,800 Units, Intravenous, Once, 6,800 Units at 08/30/24 1800 **FOLLOWED BY** heparin  ADULT infusion 100 units/mL (25000 units/250mL), 1,800 Units/hr, Intravenous, Continuous, Dew, Selinda RAMAN, MD, Last Rate: 18 mL/hr at 08/31/24 2323, 1,800 Units/hr at 08/31/24 2323   hydrALAZINE  (APRESOLINE ) injection 5 mg, 5 mg, Intravenous, Q6H PRN, Marea,  Selinda RAMAN, MD   insulin  aspart (novoLOG ) injection 0-15 Units, 0-15 Units, Subcutaneous, TID WC, Dew, Jason S, MD, 2 Units at 08/31/24 9247   loratadine  (CLARITIN ) tablet 10 mg, 10 mg, Oral, Daily PRN, Marea Selinda RAMAN, MD   LORazepam  (ATIVAN ) injection 0.5 mg, 0.5 mg, Intravenous, Q6H PRN, Marea, Selinda RAMAN, MD   ondansetron  (ZOFRAN ) tablet 4 mg, 4 mg, Oral, Q6H PRN **OR** ondansetron  (ZOFRAN ) injection 4 mg, 4 mg, Intravenous, Q6H PRN, Dew, Selinda RAMAN, MD   oxyCODONE  (Oxy IR/ROXICODONE ) immediate release tablet 5 mg, 5 mg, Oral, Q4H PRN, Dew, Jason S, MD   polyethylene glycol (MIRALAX  / GLYCOLAX ) packet 17 g, 17 g, Oral, Daily, Dew, Selinda RAMAN, MD, 17 g at 09/01/24 0827   senna-docusate (Senokot-S) tablet 1 tablet, 1 tablet, Oral, QHS PRN, Marea Selinda RAMAN, MD   traZODone  (DESYREL ) tablet 25 mg, 25 mg, Oral, QHS PRN, Marea, Selinda RAMAN, MD  "

## 2024-09-01 NOTE — Telephone Encounter (Signed)
 Received communication that patient will need to wait until 3/3 for surgery due to recent PE and needing to complete anti-coags for at least 3 weeks before they can be stopped. I have rescheduled on the OR grid. I have spoke with patient and he is also aware and verbalized understanding.

## 2024-09-01 NOTE — Telephone Encounter (Signed)
 Per Dr Babara, please schedule MD follow up early the week of 2/2. Please notify pt of appt details.

## 2024-09-01 NOTE — Consult Note (Signed)
 PHARMACY - ANTICOAGULATION CONSULT NOTE  Pharmacy Consult for heparin  dosing Indication: pulmonary embolus  Allergies[1]  Patient Measurements: Height: 5' 10 (177.8 cm) Weight: 114 kg (251 lb 5.2 oz) IBW/kg (Calculated) : 73 HEPARIN  DW (KG): 98.1  Vital Signs: Temp: 97.5 F (36.4 C) (01/22 2030) Temp Source: Oral (01/22 1710) BP: 155/86 (01/22 2030) Pulse Rate: 71 (01/22 2030)  Labs: Recent Labs    08/30/24 1311 08/30/24 1411 08/31/24 0136 08/31/24 0136 08/31/24 0942 08/31/24 1818 09/01/24 0120  HGB 13.9 13.8 13.2  --   --   --   --   HCT 40.3 40.4 38.8*  --   --   --   --   PLT 278 260 277  --   --   --   --   APTT  --  29  --   --   --   --   --   LABPROT  --  14.1  --   --   --   --   --   INR  --  1.0  --   --   --   --   --   HEPARINUNFRC  --   --  0.34   < > 0.28* <0.10* 0.35  CREATININE 0.84 0.83  --   --   --   --   --    < > = values in this interval not displayed.    Estimated Creatinine Clearance: 121.2 mL/min (by C-G formula based on SCr of 0.83 mg/dL).   Medical History: Past Medical History:  Diagnosis Date   Essential hypertension    Headache    Hepatic steatosis    History of PSVT (paroxysmal supraventricular tachycardia)    Hyperlipidemia    Kidney stone    Morbid obesity (HCC)    OSA (obstructive sleep apnea)    Pre-diabetes    Seizures (HCC)    a. On dilantin .  Last seizure 1995.   Vasovagal syncope     Medications:  No PTA anticoagulation  Assessment: Maxwell Davis is a 4 yoM presenting with concerns for pulmonary thromboembolism. Past medical history is notable for obesity (BMI 36), sleep apnea, and history of known renal mass. Patient arrived to ED due to concerns for bilateral PE at outpatient imaging appointment.   1/21 CT Chest:  Bilateral pulmonary emboli with the most proximal clot seen at the distal lobar level bilaterally. Positive for acute PE with CT evidence of right heart strain (RV/LV Ratio = 1.0) consistent with  at least submassive (intermediate risk) PE  Baseline Hgb 13.8, plt 260, aPTT 29, and INR 1.0.  1/22 Hgb and plt stable  Goal of Therapy:  Heparin  level 0.3-0.7 units/ml Monitor platelets by anticoagulation protocol: Yes  01/22 0136 HL 0.34, therapeutic x 1 01/22 0942 HL 0.28, SUBtherapeutic @ 1600 units/hr 01/22 1818 HL <0.10, subtherapeutic @ 1800 units/hr, heparin  infusion was stopped and 1548 and resumed at 1757 01/23 0120 HL 0.35, therapeutic x 1   Plan:  Will continue heparin  infusion at 1800 units/hr Check anti-Xa level in 6 hours to confirm Continue to monitor H&H and platelets  [name], PharmD, [facility] 09/01/2024 1:44 AM       [1]  Allergies Allergen Reactions   Levetiracetam Other (See Comments)    Irritable, mood swings   Penicillins Rash    Has patient had a PCN reaction causing immediate rash, facial/tongue/throat swelling, SOB or lightheadedness with hypotension: Unknown Has patient had a PCN reaction causing severe rash  involving mucus membranes or skin necrosis: Unknown Has patient had a PCN reaction that required hospitalization: No Has patient had a PCN reaction occurring within the last 10 years: No If all of the above answers are NO, then may proceed with Cephalosporin use.

## 2024-09-01 NOTE — Plan of Care (Signed)

## 2024-09-02 ENCOUNTER — Encounter: Payer: Self-pay | Admitting: Urgent Care

## 2024-09-06 NOTE — Assessment & Plan Note (Addendum)
 Right renal masses, 3.5 and 6.0 cm  - concerning for RCC  - negative thrombus, patent vein/IVC  - CT chest clear - although acute PE 1/21, now with IVC filter and Eliquis    - Cr ~0.8, normal Left kidney   Reviewed his clinical history and recent CT/MR imaging.  Also reviewed his recent PE diagnosis.  Renal masses are highly concerning for local renal cell carcinoma.  Fortunately local regional staging is negative, negative vein/IVC involvement and negative chest imaging.  I would recommend a radical nephrectomy as definitive and likely curative treatment.  However, his recent PE complicates our operative timing.  I have had thorough discussions with vascular surgery and hematology oncology.  Our care plan has been 3-4 weeks of minimum uninterrupted anticoagulation, before consideration of surgery.  He would need a proper inpatient heparin  bridge prior to surgery.  I do worry that prolonging his surgery > 6-8 weeks puts him at increasing risk of worsening gross hematuria, as well as tumor growth and possible oncologic outcomes.  Further, he will likely continue to remain at high thrombotic risk until his underlying renal cell cancer is removed.   - tentatively scheduled for 10/10/2024 for robotic right radical nephrectomy  - requires pre-op admission with heparin  bridge-- will reach out to heme/onc / medicine to organize  - Needs full anesthesia evaluation - pre-op lab work, repeat: CBC, BMP, type and screen

## 2024-09-06 NOTE — Progress Notes (Signed)
 "  09/13/2024 10:44 AM   Maxwell Davis [birth year] [id number]  Reason for visit: Follow up Right renal masses   HPI: 59 y.o. male, initial follow up with me today, previously seen by Dr. Francisca  Currently, tentatively scheduled for 10/10/2024 for robotic right radical nephrectomy First time meeting today, very pleasant gentleman Overall doing well since his recent admission, IVC filter placement No cardiopulmonary symptoms Under significant mental stress-considering this diagnosis and his wife's health No gross hematuria No right flank pain  Prior HPI: Recent workup for [facility] (Jan 2026)   - CT A/P (08/20/24) - two Right renal masses, measuring 6.5 cm and 3.8 cm, possible invasion of collecting system  - CT Chest (08/30/24) - submassive bilateral PE w/ Right hear strain, right tibial DVT  - admitted for PE workup, IVC filter placed 1/22, started on Eliquis   - Echocardiogram 08/31/2024 -reportedly normal, no significant right heart strain (report is still pending)  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   General: no acute distress, alert/oriented, conversational  HEENT: equal nondilated pupils CV: regular rate Lung: unlabored breathing, regular rate and rhythm  Abd: nondistended, nontender with palpation, no palpable masses  MSK: moving all extremities without issue, normal observed motor function  Laboratory Data:  Latest Reference Range & Units 02/19/22 03:39 03/19/22 12:25 08/19/24 07:09 08/20/24 15:02 08/30/24 13:11 08/30/24 14:11  Creatinine 0.61 - 1.24 mg/dL 9.11 8.98 9.05 9.02 9.15 0.83    Pertinent Imaging: I have personally viewed and interpreted the MRI 08/30/24 -agree with read, 3.6 right upper/interpolar renal mass, 6 cm interpolar  renal mass.  Patent right renal vein and IVC, no thrombus.  Normal-appearing contralateral left kidney..        Assessment & Plan:    Right renal mass Assessment & Plan: Right renal masses, 3.5 and 6.0 cm  - concerning for RCC  - negative thrombus, patent vein/IVC  - CT chest clear - although acute PE 1/21, now with IVC filter and Eliquis    - Cr ~0.8, normal Left kidney   Reviewed his clinical history and recent CT/MR imaging.  Also reviewed his recent PE diagnosis.  Renal masses are highly concerning for local renal cell carcinoma.  Fortunately local regional staging is negative, negative vein/IVC involvement and negative chest imaging.  I would recommend a radical nephrectomy as definitive and likely curative treatment.  However, his recent PE complicates our operative timing.  I have had thorough discussions with vascular surgery and hematology oncology.  Our care plan has been 3-4 weeks of minimum uninterrupted anticoagulation, before consideration of surgery.  He would need a proper inpatient heparin  bridge prior to surgery.  I do worry that prolonging his surgery > 6-8 weeks puts him at increasing risk of worsening gross hematuria, as well as tumor growth and possible oncologic outcomes.  Further, he will likely continue to remain at high thrombotic risk until his underlying renal cell cancer is removed.   - tentatively scheduled for 10/10/2024 for robotic right radical nephrectomy  - requires pre-op admission with heparin  bridge-- will reach out to heme/onc / medicine to organize  - Needs full anesthesia evaluation - pre-op lab work, repeat: CBC, BMP, type and screen        Maxwell JONELLE Skye, MD  [facility]  Urology [address], [address] [place], [place] [address] [phone] "

## 2024-09-07 ENCOUNTER — Inpatient Hospital Stay: Admission: RE | Admit: 2024-09-07 | Source: Ambulatory Visit

## 2024-09-08 ENCOUNTER — Telehealth: Payer: Self-pay | Admitting: Oncology

## 2024-09-08 NOTE — Telephone Encounter (Signed)
 Due to weather, clinic will be delayed start on 2/2. I called and lvm with new time for pt appt

## 2024-09-10 ENCOUNTER — Telehealth: Payer: Self-pay | Admitting: Oncology

## 2024-09-10 NOTE — Telephone Encounter (Signed)
 Due to weather, clinic is closed on 2/2. I called and spoke with pt to confirme the new appt details of a virtual visit on 2/13 instead of in person due to the full schedules. Pt confirmed new appt

## 2024-09-11 ENCOUNTER — Inpatient Hospital Stay: Admitting: Oncology

## 2024-09-13 ENCOUNTER — Encounter: Payer: Self-pay | Admitting: Urology

## 2024-09-13 ENCOUNTER — Ambulatory Visit: Admitting: Urology

## 2024-09-13 VITALS — BP 129/78 | HR 76 | Ht 70.0 in | Wt 256.0 lb

## 2024-09-13 DIAGNOSIS — N2889 Other specified disorders of kidney and ureter: Secondary | ICD-10-CM

## 2024-09-22 ENCOUNTER — Inpatient Hospital Stay: Admitting: Oncology

## 2024-10-03 ENCOUNTER — Other Ambulatory Visit

## 2024-10-10 ENCOUNTER — Inpatient Hospital Stay: Admit: 2024-10-10 | Admitting: Urology
